# Patient Record
Sex: Female | Born: 1975 | Race: White | Hispanic: No | Marital: Single | Smoking: Current every day smoker
Health system: Southern US, Community
[De-identification: ages and names within clinical notes are randomized; demographics above are authoritative.]

## PROBLEM LIST (undated history)

## (undated) DIAGNOSIS — M5430 Sciatica, unspecified side: Secondary | ICD-10-CM

## (undated) DIAGNOSIS — M419 Scoliosis, unspecified: Secondary | ICD-10-CM

## (undated) DIAGNOSIS — F419 Anxiety disorder, unspecified: Secondary | ICD-10-CM

## (undated) DIAGNOSIS — K219 Gastro-esophageal reflux disease without esophagitis: Secondary | ICD-10-CM

## (undated) DIAGNOSIS — N83209 Unspecified ovarian cyst, unspecified side: Secondary | ICD-10-CM

## (undated) DIAGNOSIS — R87619 Unspecified abnormal cytological findings in specimens from cervix uteri: Secondary | ICD-10-CM

## (undated) DIAGNOSIS — F1910 Other psychoactive substance abuse, uncomplicated: Secondary | ICD-10-CM

## (undated) HISTORY — DX: Gastro-esophageal reflux disease without esophagitis: K21.9

## (undated) HISTORY — DX: Other psychoactive substance abuse, uncomplicated: F19.10

## (undated) HISTORY — PX: TUBAL LIGATION: SHX77

## (undated) HISTORY — DX: Anxiety disorder, unspecified: F41.9

## (undated) HISTORY — DX: Unspecified abnormal cytological findings in specimens from cervix uteri: R87.619

## (undated) HISTORY — DX: Unspecified ovarian cyst, unspecified side: N83.209

---

## 2012-02-08 ENCOUNTER — Ambulatory Visit: Payer: Self-pay | Admitting: Psychiatry

## 2012-02-08 ENCOUNTER — Encounter: Payer: Self-pay | Admitting: Psychiatry

## 2012-02-08 VITALS — BP 125/82 | HR 85 | Temp 97.5°F | Ht 62.99 in | Wt 149.0 lb

## 2012-02-08 DIAGNOSIS — F1120 Opioid dependence, uncomplicated: Secondary | ICD-10-CM

## 2012-02-08 DIAGNOSIS — F1920 Other psychoactive substance dependence, uncomplicated: Secondary | ICD-10-CM

## 2012-02-08 MED ORDER — MELATONIN 3 MG PO TABS *I*
3.0000 mg | ORAL_TABLET | Freq: Every evening | ORAL | Status: DC | PRN
Start: 2012-02-08 — End: 2012-05-12

## 2012-02-09 ENCOUNTER — Ambulatory Visit
Admit: 2012-02-09 | Discharge: 2012-02-09 | Disposition: A | Discharge status: Home / Self Care | Payer: Self-pay | Source: Ambulatory Visit | Attending: Psychiatry | Admitting: Psychiatry

## 2012-02-09 LAB — DATE/TIME NOT PROVIDED

## 2012-02-09 LAB — DRUG SCREEN CHEMICAL DEPENDENCY, URINE
Amphetamine,UR: NEGATIVE
Benzodiazepinen,UR: NEGATIVE
Cocaine/Metab,UR: NEGATIVE
Opiates,UR: NEGATIVE
THC Metabolite,UR: NEGATIVE

## 2012-02-09 NOTE — Progress Notes (Signed)
Subjective:     Patient ID: Lauren Shelton is a 36 y.o. female.    HPI  New patient - Patient presents without medical records from Lauren [facility]ton and [facility] after period of drug rehab.  We were anticipating taking over suboxone prescribing however patient's urine is positive for THC.  Without records we are unable to determine if Lauren Shelton quant is trending down (as patient states it should be due to last use prior to rehab).    Lauren Shelton  does not have a problem list on file.  Current Outpatient Prescriptions   Medication   . melatonin 3 MG     No current facility-administered medications for this visit.       Review of Systems   Psychiatric/Behavioral: Positive for hallucinations, behavioral problems, sleep disturbance, dysphoric mood and agitation. The patient is nervous/anxious.              Objective:   Physical Exam   Constitutional: She is oriented to person, place, and time. She appears well-developed and well-nourished. She appears distressed.   HENT:   Head: Normocephalic and atraumatic.   Eyes: EOM are normal. Pupils are equal, round, and reactive to light. Right eye exhibits no discharge. Left eye exhibits no discharge. No scleral icterus.   Neurological: She is alert and oriented to person, place, and time. She displays normal reflexes. No cranial nerve deficit. She exhibits abnormal muscle tone. Coordination normal.   Skin: She is not diaphoretic.   BP 125/82  Pulse 85  Temp(Src) 36.4 C (97.5 F)  Ht 1.6 m (5' 2.99")  Wt 67.586 kg (149 lb)  BMI 26.4 kg/m2  SpO2 98%  LMP 01/15/2012          Assessment:      36 yo with opiate dependence presents for new physical which was unable to be completed today due to lack of medical records and request for controlled substances, psychiatric medications and THC positive urine.          Plan:      1) Opiate dependence - No suboxone refilled today due to THC positive.  Awaiting quantitative confirmation.  Patient getting bridge script from Lauren Shelton.  Anticipate filling suboxone 8-2mg  1.5 tablets daily for 28 days with clean urine.    2) Follow up - Visit spent obtaining contact information to fax records release.  Releases faxed successfully.  Awaiting records.  Patient has bridge scripts for psychiatric medications.

## 2012-02-21 ENCOUNTER — Encounter: Payer: Self-pay | Admitting: Psychiatry

## 2012-02-21 ENCOUNTER — Ambulatory Visit: Payer: Self-pay | Admitting: Psychiatry

## 2012-02-21 VITALS — BP 130/73 | HR 85 | Temp 98.4°F | Ht 62.5 in | Wt 152.0 lb

## 2012-02-21 DIAGNOSIS — M549 Dorsalgia, unspecified: Secondary | ICD-10-CM

## 2012-02-21 DIAGNOSIS — G4700 Insomnia, unspecified: Secondary | ICD-10-CM | POA: Insufficient documentation

## 2012-02-21 DIAGNOSIS — F32A Depression, unspecified: Secondary | ICD-10-CM | POA: Insufficient documentation

## 2012-02-21 DIAGNOSIS — F1120 Opioid dependence, uncomplicated: Secondary | ICD-10-CM | POA: Insufficient documentation

## 2012-02-21 DIAGNOSIS — F419 Anxiety disorder, unspecified: Secondary | ICD-10-CM

## 2012-02-21 MED ORDER — THERMACARE BACK/HIP MISC
1.0000 | Freq: Every day | Status: AC
Start: 2012-02-21 — End: ?

## 2012-02-21 MED ORDER — BUPRENORPHINE HCL-NALOXONE HCL 8-2 MG SL SUBL *I*
1.0000 | SUBLINGUAL_TABLET | Freq: Every day | SUBLINGUAL | Status: DC
Start: 2012-02-21 — End: 2012-03-20

## 2012-02-21 MED ORDER — MELOXICAM 7.5 MG PO TABS *I*
7.5000 mg | ORAL_TABLET | Freq: Every day | ORAL | Status: AC
Start: 2012-02-21 — End: 2012-03-22

## 2012-02-21 MED ORDER — VENLAFAXINE HCL 37.5 MG PO CP24 *I*
75.0000 mg | ORAL_CAPSULE | Freq: Every day | ORAL | Status: DC
Start: 2012-02-21 — End: 2012-03-21

## 2012-02-21 MED ORDER — HYDROXYZINE PAMOATE 25 MG PO CAPS *I*
25.0000 mg | ORAL_CAPSULE | Freq: Every day | ORAL | Status: DC | PRN
Start: 2012-02-21 — End: 2012-05-12

## 2012-02-21 NOTE — Progress Notes (Signed)
Subjective:     Patient ID: Lauren Shelton is a 36 y.o. female.    HPI  1) 36 yo with opiate dependence with successful completion of rehab at [facility].  SHe is motivated to get custody of her children back and also pay off a DWI fine.    Review of Systems   Constitutional: Negative for fever, chills, diaphoresis, activity change, appetite change, fatigue and unexpected weight change.   Gastrointestinal: Negative for abdominal pain and abdominal distention.   Musculoskeletal: Positive for back pain, arthralgias and gait problem.   Psychiatric/Behavioral: Positive for sleep disturbance, dysphoric mood and decreased concentration. Negative for suicidal ideas. The patient is nervous/anxious.              Objective:   Physical Exam   Constitutional: She is oriented to person, place, and time. She appears well-developed and well-nourished. No distress.   HENT:   Head: Normocephalic and atraumatic.   Cardiovascular: Normal rate and regular rhythm.    No murmur heard.  Pulmonary/Chest: Effort normal and breath sounds normal. No respiratory distress.   Musculoskeletal:   Scoliosis noted with left curvature of spine.   Neurological: She is alert and oriented to person, place, and time. No cranial nerve deficit.   Skin: She is not diaphoretic.   Psychiatric: She has a normal mood and affect.             Assessment:      Opiate dependence        Plan:      1) Opiate dependence - Suboxone refilled today 8-2mg  1.5 tablets daily for 28 days with clean urine. THC negative.

## 2012-03-20 ENCOUNTER — Ambulatory Visit: Payer: Self-pay | Admitting: Psychiatry

## 2012-03-20 ENCOUNTER — Encounter: Payer: Self-pay | Admitting: Psychiatry

## 2012-03-20 VITALS — BP 128/81 | HR 72 | Temp 97.5°F | Ht 62.6 in | Wt 162.0 lb

## 2012-03-20 DIAGNOSIS — F1120 Opioid dependence, uncomplicated: Secondary | ICD-10-CM

## 2012-03-20 LAB — POCT URINE DRUG SCREEN 12 PANEL
AMP (1000ng/mL): NEGATIVE
BAR (300ng/mL): NEGATIVE ng/mL
BZD (300 ng/mL): NEGATIVE
COC (300ng/mL): NEGATIVE
Kit Lot: 1311003
MDMA (500ng/mL): NEGATIVE
MET (1000ng/mL): NEGATIVE
MTD (300ng/mL): NEGATIVE
OPI (300ng/mL): NEGATIVE
OXY (100ng/mL): NEGATIVE
PCP (25ng/mL): NEGATIVE
TCA (1000ng/mL): NEGATIVE
THC (50ng/mL): NEGATIVE

## 2012-03-20 MED ORDER — BUPRENORPHINE HCL-NALOXONE HCL 8-2 MG SL SUBL *I*
1.0000 | SUBLINGUAL_TABLET | Freq: Every day | SUBLINGUAL | Status: AC
Start: 2012-03-20 — End: 2012-04-17

## 2012-03-20 NOTE — Progress Notes (Signed)
Subjective:     Patient ID: Lauren Shelton is a 36 y.o. female.    HPI  1) 36 yo with opiate dependence with successful completion of rehab at [facility]. SHe is motivated to get custody of her children back and also pay off a DWI fine.    Lauren Shelton has Back pain; Anxiety; Depression; Opiate dependence; and Insomnia on her problem list.  Current Outpatient Prescriptions   Medication Sig Note   . buprenorphine-naloxone (SUBOXONE) 8-2 MG SL tablet Place 1 tablet under the tongue daily   MDD 1.5 tablets    . venlafaxine (EFFEXOR-XR) 37.5 MG 24 hr capsule Take 2 capsules (75 mg total) by mouth daily   After initiating with 1 tablet daily for 1 week    . hydrOXYzine pamoate (VISTARIL) 25 MG capsule Take 1 capsule (25 mg total) by mouth daily as needed for Anxiety or Sleep   May take 2 capsules at once.  MDD=2    . meloxicam (MOBIC) 7.5 MG tablet Take 1 tablet (7.5 mg total) by mouth daily    . melatonin 3 MG Take 1 tablet (3 mg total) by mouth nightly as needed for Sleep    . Heat Wraps (THERMACARE BACK/HIP) MISC By 1 each no specified route daily 03/20/2012: Insurance does not cover     No current facility-administered medications for this visit.       Review of Systems  Constitutional: Negative for fever, chills, diaphoresis, activity change, appetite change, fatigue and unexpected weight change.   Gastrointestinal: Negative for abdominal pain and abdominal distention.   Musculoskeletal: Positive for back pain, arthralgias and gait problem.   Psychiatric/Behavioral: Positive for sleep disturbance, dysphoric mood and decreased concentration. Negative for suicidal ideas. The patient is nervous/anxious.        Objective:   Physical Exam  BP 128/81  Pulse 72  Temp(Src) 36.4 C (97.5 F)  Ht 1.59 m (5' 2.6")  Wt 73.483 kg (162 lb)  BMI 29.07 kg/m2  SpO2 99%  LMP 03/13/2012  Constitutional: She is oriented to person, place, and time. She appears well-developed and well-nourished. No distress.   HENT:   Head:  Normocephalic and atraumatic.   Cardiovascular: Normal rate and regular rhythm.   No murmur heard.   Pulmonary/Chest: Effort normal and breath sounds normal. No respiratory distress.   Musculoskeletal:   Scoliosis noted with left curvature of spine.   Neurological: She is alert and oriented to person, place, and time. No cranial nerve deficit.   Skin: She is not diaphoretic.   Psychiatric: She has a normal mood and affect          Assessment:      36 yo with opiate dependence.        Plan:      1) Opiate dependence - Suboxone refilled today 8-2mg  1.5 tablets daily for 28 days with clean urine. THC negative.

## 2012-03-21 ENCOUNTER — Other Ambulatory Visit: Payer: Self-pay | Admitting: Psychiatry

## 2012-03-21 DIAGNOSIS — F32A Depression, unspecified: Secondary | ICD-10-CM

## 2012-03-23 ENCOUNTER — Other Ambulatory Visit: Payer: Self-pay | Admitting: Psychiatry

## 2012-03-23 DIAGNOSIS — F32A Depression, unspecified: Secondary | ICD-10-CM

## 2012-03-23 MED ORDER — VENLAFAXINE HCL 37.5 MG PO CP24 *I*
75.0000 mg | ORAL_CAPSULE | Freq: Every day | ORAL | Status: DC
Start: 2012-03-23 — End: 2012-05-12

## 2012-04-03 ENCOUNTER — Telehealth: Payer: Self-pay

## 2012-04-03 NOTE — Telephone Encounter (Signed)
Lauren Shelton has been placed at the [facility] as temporary housing until her apartment is available Jan 1. She is sharing a room with another homeless person and discovered that her Effexor and Suboxone is missing. Pt filed a police report and spoke to [employer] about the loss. Will speak with Dr Dellis Anes tomorrow about the situation and call pt back.

## 2012-05-12 ENCOUNTER — Ambulatory Visit: Payer: Self-pay | Admitting: Psychiatry

## 2012-05-12 ENCOUNTER — Encounter: Payer: Self-pay | Admitting: Psychiatry

## 2012-05-12 VITALS — BP 131/85 | HR 115 | Temp 97.2°F | Ht 62.6 in | Wt 174.0 lb

## 2012-05-12 DIAGNOSIS — F419 Anxiety disorder, unspecified: Secondary | ICD-10-CM

## 2012-05-12 DIAGNOSIS — F1120 Opioid dependence, uncomplicated: Secondary | ICD-10-CM

## 2012-05-12 DIAGNOSIS — Z760 Encounter for issue of repeat prescription: Secondary | ICD-10-CM

## 2012-05-12 LAB — POCT URINE DRUG SCREEN 12 PANEL
AMP (1000ng/mL): NEGATIVE
BAR (300ng/mL): NEGATIVE ng/mL
BZD (300 ng/mL): NEGATIVE
COC (300ng/mL): NEGATIVE
Kit Lot: 1311003
MDMA (500ng/mL): NEGATIVE
MET (1000ng/mL): NEGATIVE
MTD (300ng/mL): NEGATIVE
OPI (300ng/mL): NEGATIVE
OXY (100ng/mL): NEGATIVE
PCP (25ng/mL): NEGATIVE
TCA (1000ng/mL): NEGATIVE
THC (50ng/mL): NEGATIVE

## 2012-05-12 MED ORDER — VENLAFAXINE HCL 150 MG PO CP24 *I*
150.0000 mg | ORAL_CAPSULE | Freq: Every day | ORAL | Status: DC
Start: 2012-05-12 — End: 2012-07-07

## 2012-05-12 MED ORDER — HYDROXYZINE PAMOATE 25 MG PO CAPS *I*
25.0000 mg | ORAL_CAPSULE | Freq: Every day | ORAL | Status: AC | PRN
Start: 2012-05-12 — End: 2012-06-09

## 2012-05-12 MED ORDER — BUPRENORPHINE HCL-NALOXONE HCL 8-2 MG SL SUBL *I*
1.0000 | SUBLINGUAL_TABLET | Freq: Every day | SUBLINGUAL | Status: DC
Start: 2012-05-12 — End: 2012-06-09

## 2012-05-12 NOTE — Progress Notes (Signed)
Subjective:     Patient ID: Lauren Shelton is a 37 y.o. female.    HPI  1) Opiate dependence - 37 yo with opiate dependence with successful completion of rehab at [facility]. She is motivated to get custody of her children back and also pay off a DWI fine.    2) Depression - Patient reports insomnia, excessive worry, low mood, fatigue and decreased interest.  She reports having access to psychiatry but has not scheduled a visit in a long time.  Requested consideration of increasing her Effexor.  Side effect profile discussed at length.  Patient denies seide effects.    Mylia has Back pain; Anxiety; Depression; Opiate dependence; and Insomnia on her problem list.    Review of Systems   Constitutional: Positive for activity change, appetite change, fatigue and unexpected weight change. Negative for fever and chills.        Decreased activity due to psychosocial stressors.  Weight gain.   Psychiatric/Behavioral: Positive for sleep disturbance, dysphoric mood and agitation. Negative for hallucinations. The patient is nervous/anxious. The patient is not hyperactive.              Objective:   Physical Exam   Constitutional: She is oriented to person, place, and time. She appears well-developed and well-nourished. No distress.   Pulmonary/Chest: Effort normal. No respiratory distress.   Neurological: She is alert and oriented to person, place, and time. No cranial nerve deficit.   Skin: She is not diaphoretic.             Assessment:      37 yo with opiate dependence, psychosocial stressors and depression        Plan:      1) Opiate dependence - Suboxone refilled today 8-2mg  1.5 tablets daily for 28 days with clean urine. THC negative.    2) Depression - Increased Effexor to 150 mg daily.  Side effect profile reviewed.  Questions answered.

## 2012-05-22 ENCOUNTER — Encounter: Payer: Self-pay | Admitting: Psychiatry

## 2012-06-09 ENCOUNTER — Ambulatory Visit: Payer: Self-pay | Admitting: Psychiatry

## 2012-06-09 ENCOUNTER — Encounter: Payer: Self-pay | Admitting: Psychiatry

## 2012-06-09 VITALS — BP 126/68 | HR 62 | Temp 97.2°F | Ht 62.6 in | Wt 167.0 lb

## 2012-06-09 LAB — POCT URINE DRUG SCREEN 12 PANEL
AMP (1000ng/mL): NEGATIVE
BAR (300ng/mL): NEGATIVE ng/mL
BZD (300 ng/mL): NEGATIVE
COC (300ng/mL): NEGATIVE
Kit Lot: 1311003
MDMA (500ng/mL): NEGATIVE
MET (1000ng/mL): NEGATIVE
MTD (300ng/mL): NEGATIVE
OPI (300ng/mL): NEGATIVE
OXY (100ng/mL): NEGATIVE
PCP (25ng/mL): NEGATIVE
TCA (1000ng/mL): NEGATIVE
THC (50ng/mL): NEGATIVE

## 2012-06-09 MED ORDER — BUPRENORPHINE HCL-NALOXONE HCL 8-2 MG SL SUBL *I*
1.0000 | SUBLINGUAL_TABLET | Freq: Every day | SUBLINGUAL | Status: DC
Start: 2012-06-09 — End: 2012-07-07

## 2012-06-09 NOTE — Progress Notes (Signed)
Subjective:     Patient ID: Lauren Shelton is a 37 y.o. female.    HPI  1) Opiate dependence - 37 yo with opiate dependence with successful completion of rehab at [facility] followed by Victorino Dike who has let [facility] know that [place] has missed several appointments including group and mental health appointments. She is motivated to get custody of her children back and also pay off a DWI fine.     2) Depression - Patient reports insomnia, excessive worry, low mood, fatigue and decreased interest. She reports having access to psychiatry but has not scheduled a visit in a long time. Requested consideration of increasing her Effexor. Side effect profile discussed at length. Patient denies side effects.     [name] has Back pain; Anxiety; Depression; Opiate dependence; and Insomnia on her problem list.    Review of Systems   All other systems reviewed and are negative.              Objective:   Physical Exam   Constitutional: She is oriented to person, place, and time. She appears well-developed and well-nourished. No distress.   Cardiovascular: Normal rate and regular rhythm.    No murmur heard.  Pulmonary/Chest: Effort normal and breath sounds normal. No respiratory distress.   Neurological: She is alert and oriented to person, place, and time.   Skin: Skin is warm and dry. She is not diaphoretic.   Psychiatric: She has a normal mood and affect.             Assessment:      37 yo with opiate dependence        Plan:      1) Opiate dependence - Suboxone refilled today 8-2mg  1.5 tablets daily for 28 days with clean urine. THC negative. Zero refills.  - Consider one refill at next appointment with clean urine screen and note of improvement from patient's counselor, Victorino Dike.    2) Depression - Improved with increased Effexor to 150 mg daily last visit.

## 2012-07-07 ENCOUNTER — Encounter: Payer: Self-pay | Admitting: Psychiatry

## 2012-07-07 ENCOUNTER — Ambulatory Visit: Payer: Self-pay | Admitting: Psychiatry

## 2012-07-07 VITALS — BP 132/78 | HR 73 | Temp 97.2°F | Ht 62.6 in | Wt 164.0 lb

## 2012-07-07 DIAGNOSIS — F1120 Opioid dependence, uncomplicated: Secondary | ICD-10-CM

## 2012-07-07 DIAGNOSIS — F32A Depression, unspecified: Secondary | ICD-10-CM

## 2012-07-07 LAB — POCT URINE DRUG SCREEN 12 PANEL
AMP (1000ng/mL): NEGATIVE
BAR (300ng/mL): NEGATIVE ng/mL
BZD (300 ng/mL): NEGATIVE
COC (300ng/mL): NEGATIVE
Kit Lot: 1311003
MDMA (500ng/mL): NEGATIVE
MET (1000ng/mL): NEGATIVE
MTD (300ng/mL): NEGATIVE
OPI (300ng/mL): NEGATIVE
OXY (100ng/mL): NEGATIVE
PCP (25ng/mL): NEGATIVE
TCA (1000ng/mL): NEGATIVE
THC (50ng/mL): NEGATIVE

## 2012-07-07 MED ORDER — VENLAFAXINE HCL 150 MG PO CP24 *I*
150.0000 mg | ORAL_CAPSULE | Freq: Every day | ORAL | Status: AC
Start: 2012-07-07 — End: ?

## 2012-07-07 MED ORDER — BUPRENORPHINE HCL-NALOXONE HCL 8-2 MG SL SUBL *I*
1.5000 | SUBLINGUAL_TABLET | Freq: Every day | SUBLINGUAL | Status: AC
Start: 2012-07-07 — End: 2012-08-04

## 2012-07-19 NOTE — Progress Notes (Signed)
Subjective:     Patient ID: Lauren Shelton is a 37 y.o. female.    HPI  1) Opiate dependence - 37 yo with opiate dependence with successful completion of rehab at [facility] followed by Victorino Dike.  Patient is motivated to get custody of her children back and also pay off a DWI fine.     2) Depression - Patient reports improved insomnia, excessive worry, low mood, fatigue and decreased interest. She reports having access to psychiatry but has not scheduled a visit in a long time. Requested consideration of increasing her Effexor. Side effect profile discussed at length. Patient denies side effects.     Lauren Shelton has Back pain; Anxiety; Depression; Opiate dependence; and Insomnia on her problem list.    Review of Systems   All other systems reviewed and are negative.              Objective:   Physical Exam  BP 132/78  Pulse 73  Temp(Src) 36.2 C (97.2 F)  Ht 1.59 m (5' 2.6")  Wt 74.39 kg (164 lb)  BMI 29.43 kg/m2  SpO2 98%  LMP 06/30/2012  Constitutional: She is oriented to person, place, and time. She appears well-developed and well-nourished. No distress.   Cardiovascular: Normal rate and regular rhythm.   No murmur heard.   Pulmonary/Chest: Effort normal and breath sounds normal. No respiratory distress.   Neurological: She is alert and oriented to person, place, and time.   Skin: Skin is warm and dry. She is not diaphoretic.   Psychiatric: She has a normal mood and affect          Assessment:      37 yo with opiate dependence          Plan:      1) Opiate dependence - Suboxone refilled today 8-2mg  1.5 tablets daily for 28 days with clean urine. THC negative. One refill dispensed today.   - Consider one refill at next appointment with clean urine screen and note of improvement from patient's counselor, Victorino Dike.     2) Depression - Improved with increased Effexor to 150 mg daily

## 2012-08-07 ENCOUNTER — Telehealth: Payer: Self-pay

## 2012-08-07 NOTE — Telephone Encounter (Signed)
Call received from [place] [facility]. She left message of concern regarding [place]. States she heard from family that [place] has relapsed on heroin and is diverting her Suboxone to pay for it. Victorino Dike states she has release to speak with the family, but has not seen Anjali since the report of needles seen in her home, track marks on her legs and nodding behaviors while with family over the holiday. Savannaha has been attending program and is due to see Victorino Dike today. She will have tox screen done today. Victorino Dike will call to follow up on this.

## 2012-08-31 ENCOUNTER — Ambulatory Visit: Payer: Self-pay | Admitting: Psychiatry

## 2012-08-31 ENCOUNTER — Encounter: Payer: Self-pay | Admitting: Psychiatry

## 2012-08-31 VITALS — BP 111/73 | HR 71 | Temp 96.8°F | Ht 62.6 in | Wt 157.0 lb

## 2012-08-31 DIAGNOSIS — F1120 Opioid dependence, uncomplicated: Secondary | ICD-10-CM

## 2012-08-31 DIAGNOSIS — Z79899 Other long term (current) drug therapy: Secondary | ICD-10-CM

## 2012-08-31 LAB — POCT URINE DRUG SCREEN 12 PANEL
AMP (1000ng/mL): NEGATIVE
BAR (300ng/mL): NEGATIVE ng/mL
BZD (300 ng/mL): NEGATIVE
COC (300ng/mL): NEGATIVE
Kit Lot: 1311003
MDMA (500ng/mL): NEGATIVE
MET (1000ng/mL): NEGATIVE
MTD (300ng/mL): NEGATIVE
OPI (300ng/mL): NEGATIVE
OXY (100ng/mL): NEGATIVE
PCP (25ng/mL): POSITIVE
TCA (1000ng/mL): NEGATIVE
THC (50ng/mL): NEGATIVE

## 2012-08-31 MED ORDER — BUPRENORPHINE HCL-NALOXONE HCL 8-2 MG SL SUBL *I*
1.0000 | SUBLINGUAL_TABLET | Freq: Every day | SUBLINGUAL | Status: DC
Start: 2012-08-31 — End: 2012-09-01

## 2012-09-01 MED ORDER — BUPRENORPHINE HCL-NALOXONE HCL 8-2 MG SL SUBL *I*
1.5000 | SUBLINGUAL_TABLET | Freq: Every day | SUBLINGUAL | Status: AC
Start: 2012-09-01 — End: 2012-09-29

## 2013-04-23 NOTE — Progress Notes (Signed)
Subjective:     Patient ID: Lauren Shelton is a 38 y.o. female.    HPI  1) Opiate dependence - 38 yo with opiate dependence with successful completion of rehab at [facility] followed by Victorino Dike. Patient is motivated to get custody of her children back and also pay off a DWI fine. Noted irregularities in program attendance require follow up between PCP and Victorino Dike which was discussed with patient today.    Joani has Back pain; Anxiety; Depression; Opiate dependence; and Insomnia on her problem list.    Review of Systems   Psychiatric/Behavioral: Positive for dysphoric mood. Negative for hallucinations, behavioral problems and confusion. The patient is nervous/anxious.              Objective:   Physical Exam  BP 111/73  Pulse 71  Temp(Src) 36 C (96.8 F)  Ht 1.59 m (5' 2.6")  Wt 71.215 kg (157 lb)  BMI 28.17 kg/m2  SpO2 98%  LMP 08/31/2012  Constitutional: She is oriented to person, place, and time. She appears well-developed and well-nourished. No distress.   Cardiovascular: Normal rate and regular rhythm.   No murmur heard.   Pulmonary/Chest: Effort normal and breath sounds normal. No respiratory distress.   Neurological: She is alert and oriented to person, place, and time.   Skin: Skin is warm and dry. She is not diaphoretic.   Psychiatric: She has a normal mood and affect          Assessment:      38 yo with opiate dependence        Plan:      1) Opiate dependence - Suboxone refilled today 8-2mg  1.5 tablets daily for 28 days with clean urine. THC negative. One refill dispensed today.   - Awaiting communication with Victorino Dike.     2) Depression - Improved with increased Effexor to 150 mg daily

## 2013-11-16 ENCOUNTER — Encounter: Payer: Self-pay | Admitting: Gastroenterology

## 2014-12-04 ENCOUNTER — Encounter (HOSPITAL_COMMUNITY): Payer: Self-pay

## 2014-12-04 ENCOUNTER — Emergency Department (HOSPITAL_COMMUNITY)
Admission: EM | Admit: 2014-12-04 | Discharge: 2014-12-04 | Disposition: A | Discharge status: Home / Self Care | Payer: Medicaid Other | Attending: Emergency Medicine | Admitting: Emergency Medicine

## 2014-12-04 ENCOUNTER — Emergency Department (HOSPITAL_COMMUNITY): Payer: Medicaid Other

## 2014-12-04 DIAGNOSIS — R110 Nausea: Secondary | ICD-10-CM | POA: Insufficient documentation

## 2014-12-04 DIAGNOSIS — Z9851 Tubal ligation status: Secondary | ICD-10-CM | POA: Insufficient documentation

## 2014-12-04 DIAGNOSIS — R1031 Right lower quadrant pain: Secondary | ICD-10-CM | POA: Insufficient documentation

## 2014-12-04 DIAGNOSIS — Z3202 Encounter for pregnancy test, result negative: Secondary | ICD-10-CM | POA: Diagnosis not present

## 2014-12-04 DIAGNOSIS — N939 Abnormal uterine and vaginal bleeding, unspecified: Secondary | ICD-10-CM | POA: Diagnosis not present

## 2014-12-04 DIAGNOSIS — R52 Pain, unspecified: Secondary | ICD-10-CM

## 2014-12-04 DIAGNOSIS — Z720 Tobacco use: Secondary | ICD-10-CM | POA: Diagnosis not present

## 2014-12-04 LAB — URINALYSIS, ROUTINE W REFLEX MICROSCOPIC
BILIRUBIN URINE: NEGATIVE
Glucose, UA: NEGATIVE mg/dL
HGB URINE DIPSTICK: NEGATIVE
Ketones, ur: 15 mg/dL — AB
Leukocytes, UA: NEGATIVE
Nitrite: NEGATIVE
PH: 7 (ref 5.0–8.0)
Protein, ur: NEGATIVE mg/dL
SPECIFIC GRAVITY, URINE: 1.026 (ref 1.005–1.030)
UROBILINOGEN UA: 1 mg/dL (ref 0.0–1.0)

## 2014-12-04 LAB — COMPREHENSIVE METABOLIC PANEL
ALBUMIN: 4.5 g/dL (ref 3.5–5.0)
ALT: 15 U/L (ref 14–54)
ANION GAP: 7 (ref 5–15)
AST: 20 U/L (ref 15–41)
Alkaline Phosphatase: 62 U/L (ref 38–126)
BUN: 19 mg/dL (ref 6–20)
CHLORIDE: 107 mmol/L (ref 101–111)
CO2: 25 mmol/L (ref 22–32)
Calcium: 9.3 mg/dL (ref 8.9–10.3)
Creatinine, Ser: 0.65 mg/dL (ref 0.44–1.00)
GFR calc Af Amer: >60 mL/min (ref >60)
GFR calc non Af Amer: >60 mL/min (ref >60)
GLUCOSE: 91 mg/dL (ref 65–99)
POTASSIUM: 4.2 mmol/L (ref 3.5–5.1)
SODIUM: 139 mmol/L (ref 135–145)
TOTAL PROTEIN: 7.8 g/dL (ref 6.5–8.1)
Total Bilirubin: 0.8 mg/dL (ref 0.3–1.2)

## 2014-12-04 LAB — CBC
HEMATOCRIT: 34.2 % — AB (ref 36.0–46.0)
Hemoglobin: 11 g/dL — ABNORMAL LOW (ref 12.0–15.0)
MCH: 28.5 pg (ref 26.0–34.0)
MCHC: 32.2 g/dL (ref 30.0–36.0)
MCV: 88.6 fL (ref 78.0–100.0)
Platelets: 302 10*3/uL (ref 150–400)
RBC: 3.86 MIL/uL — ABNORMAL LOW (ref 3.87–5.11)
RDW: 14.7 % (ref 11.5–15.5)
WBC: 8.1 10*3/uL (ref 4.0–10.5)

## 2014-12-04 LAB — LIPASE, BLOOD: LIPASE: 20 U/L — AB (ref 22–51)

## 2014-12-04 LAB — HCG, QUANTITATIVE, PREGNANCY

## 2014-12-04 LAB — I-STAT BETA HCG BLOOD, ED (MC, WL, AP ONLY): HCG, QUANTITATIVE: 6.2 m[IU]/mL — AB (ref <5)

## 2014-12-04 MED ORDER — HYDROCODONE-ACETAMINOPHEN 5-325 MG PO TABS: 2.0000 | ORAL_TABLET | ORAL | Status: DC | PRN

## 2014-12-04 NOTE — ED Notes (Signed)
Patient c/o right lower abdominal pain. Patient states she had a tubal ligation 4 months ago andstates that she has been having intermittent vaginal bleeding x 3 weeks. Patient also c/o nausea. Patient denies dysuria or fever.

## 2014-12-04 NOTE — ED Provider Notes (Signed)
Medical screening examination/treatment/procedure(s) were conducted as a shared visit with non-physician practitioner(s) and myself.  I personally evaluated the patient during the encounter.   EKG Interpretation None     Pt with lower abdominal pain, ongoing, tender in right lower quadrant, do not suspect appy currently with clinical appearance. Likely d/c if imaging/pelvic negative.   See related encounter note   Lyndal Pulley, MD 12/04/14 2102

## 2014-12-04 NOTE — Discharge Instructions (Signed)

## 2014-12-04 NOTE — ED Provider Notes (Signed)
CSN: [id number]     Arrival date & time 12/04/14  1427 History   First MD Initiated Contact with Patient 12/04/14 1806     Chief Complaint  Patient presents with  . Abdominal Pain  . Nausea  . Vaginal Bleeding     (Consider location/radiation/quality/duration/timing/severity/associated sxs/prior Treatment) Patient is a 39 y.o. female presenting with abdominal pain. The history is provided by the patient. No language interpreter was used.  Abdominal Pain Pain location:  RLQ Pain quality: aching   Pain radiates to:  Does not radiate Pain severity:  Moderate Onset quality:  Gradual Timing:  Constant Progression:  Worsening Chronicity:  New Context: previous surgery   Context: not recent sexual activity   Relieved by:  Nothing Worsened by:  Nothing tried Ineffective treatments:  None tried Associated symptoms: no dysuria, no nausea and no vomiting   Risk factors: has not had multiple surgeries and not pregnant   Pt had a Tubal ligation 4 months ago.  Pt reports she has had vaginal bleeding for the past 3 weeks.  Pt reports increasing discomfort in right lower abdomen.  Pt worried about something being wrong with "clips"   History reviewed. No pertinent past medical history. Past Surgical History  Procedure Laterality Date  . Tubal ligation    . Cesarean section     History reviewed. No pertinent family history. Social History  Substance Use Topics  . Smoking status: Current Every Day Smoker -- 0.15 packs/day    Types: Cigarettes  . Smokeless tobacco: Never Used  . Alcohol Use: No   OB History    No data available     Review of Systems  Gastrointestinal: Positive for abdominal pain. Negative for nausea and vomiting.  Genitourinary: Negative for dysuria.  All other systems reviewed and are negative.     Allergies  Review of patient's allergies indicates no known allergies.  Home Medications   Prior to Admission medications   Medication Sig Start Date End Date  Taking? Authorizing Provider  ibuprofen (ADVIL,MOTRIN) 200 MG tablet Take 800-1,000 mg by mouth every 6 (six) hours as needed for headache, mild pain or moderate pain.   Yes Historical Provider, MD   BP 77/58 mmHg  Pulse 64  Temp(Src) 98.2 F (36.8 C) (Oral)  Resp 18  Ht  (1.6 m)  Wt 145 lb (65.772 kg)  BMI 25.69 kg/m2  SpO2 100%  LMP 12/04/2014 Physical Exam  Constitutional: She is oriented to person, place, and time. She appears well-developed and well-nourished.  HENT:  Head: Normocephalic and atraumatic.  Eyes: Conjunctivae and EOM are normal. Pupils are equal, round, and reactive to light.  Neck: Normal range of motion.  Cardiovascular: Normal rate and normal heart sounds.   Pulmonary/Chest: Effort normal.  Abdominal: She exhibits no distension. There is tenderness.  Genitourinary: Vagina normal.  Mild bleeding  Musculoskeletal: Normal range of motion.  Neurological: She is alert and oriented to person, place, and time.  Skin: Skin is warm.  Psychiatric: She has a normal mood and affect.  Nursing note and vitals reviewed.   ED Course  Procedures (including critical care time) Labs Review Labs Reviewed  LIPASE, BLOOD - Abnormal; Notable for the following:    Lipase 20 (*)    All other components within normal limits  CBC - Abnormal; Notable for the following:    RBC 3.86 (*)    Hemoglobin 11.0 (*)    HCT 34.2 (*)    All other components within normal limits  URINALYSIS, ROUTINE W REFLEX MICROSCOPIC (NOT AT [facility]) - Abnormal; Notable for the following:    APPearance CLOUDY (*)    Ketones, ur 15 (*)    All other components within normal limits  I-STAT BETA HCG BLOOD, ED ([facility], [facility], [facility] ONLY) - Abnormal; Notable for the following:    I-stat hCG, quantitative 6.2 (*)    All other components within normal limits  COMPREHENSIVE METABOLIC PANEL  HCG, QUANTITATIVE, PREGNANCY    Imaging Review Us Transvaginal Non-ob  12/04/2014   CLINICAL DATA:  Right lower quadrant  pain  EXAM: TRANSABDOMINAL AND TRANSVAGINAL ULTRASOUND OF PELVIS  TECHNIQUE: Both transabdominal and transvaginal ultrasound examinations of the pelvis were performed. Transabdominal technique was performed for global imaging of the pelvis including uterus, ovaries, adnexal regions, and pelvic cul-de-sac. It was necessary to proceed with endovaginal exam following the transabdominal exam to visualize the endometrium and ovaries.  COMPARISON:  None  FINDINGS: Uterus  Measurements: 8.5 x 4.5 x 6.1 cm. No fibroids or other mass visualized.  Endometrium  Thickness: 8 mm. Small 4 mm hypoechoic area at the distal most aspect of the endometrium near the fundus which maybe adjacent to versus within the endometrium.  Right ovary  Measurements: 2.5 x 1.6 x 1.6 cm. Normal appearance/no adnexal mass.  Left ovary  Measurements: 3.2 x 1.5 x 2.6 cm. Normal appearance/no adnexal mass.  Other findings  No free fluid.  IMPRESSION: 1. No acute pelvic abnormality. 2. Small 4 mm hypoechoic area at the distal most aspect of the endometrium near the fundus which maybe adjacent to versus within the endometrium. This may represent a small amount of fluid versus a hypoechoic lesion. Recommend follow-up pelvic ultrasound in 6-8 weeks.   Electronically Signed   By: Hetal  Patel   On: 12/04/2014 20:35   Us Pelvis Complete  12/04/2014   CLINICAL DATA:  Right lower quadrant pain  EXAM: TRANSABDOMINAL AND TRANSVAGINAL ULTRASOUND OF PELVIS  TECHNIQUE: Both transabdominal and transvaginal ultrasound examinations of the pelvis were performed. Transabdominal technique was performed for global imaging of the pelvis including uterus, ovaries, adnexal regions, and pelvic cul-de-sac. It was necessary to proceed with endovaginal exam following the transabdominal exam to visualize the endometrium and ovaries.  COMPARISON:  None  FINDINGS: Uterus  Measurements: 8.5 x 4.5 x 6.1 cm. No fibroids or other mass visualized.  Endometrium  Thickness: 8 mm. Small  4 mm hypoechoic area at the distal most aspect of the endometrium near the fundus which maybe adjacent to versus within the endometrium.  Right ovary  Measurements: 2.5 x 1.6 x 1.6 cm. Normal appearance/no adnexal mass.  Left ovary  Measurements: 3.2 x 1.5 x 2.6 cm. Normal appearance/no adnexal mass.  Other findings  No free fluid.  IMPRESSION: 1. No acute pelvic abnormality. 2. Small 4 mm hypoechoic area at the distal most aspect of the endometrium near the fundus which maybe adjacent to versus within the endometrium. This may represent a small amount of fluid versus a hypoechoic lesion. Recommend follow-up pelvic ultrasound in 6-8 weeks.   Electronically Signed   By: Hetal  Patel   On: 12/04/2014 20:35   I have personally reviewed and evaluated these images and lab results as part of my medical decision-making.   EKG Interpretation None      MDM normal wbc count  istat hcg is 6.2.   (lab hcg is less than 1)  Pelvis ultrasound shows [id number]ryland34miVernMaryland37miVernMaryland23miVernMaryland40miVernMaryland77miVernMaryland33miVernMaryland1miVernMaryland68miVernMarylandie Murdersat may be small amount of fluid.  No evidence of ovarian  cyst,  Pt reports being hungry.  Clinical presentation seems to be gynecologic.  I doubt appendicitis given history.  (Pt complains of being tired and hungry and wants to go home.  She has made plans to see her Gyn in the am.  I advised pt she may need further evaluation including ct scan that may require further emergency department evaluation.  Pt understands.  Pt counseled on risk and possible diagnoses.    Final diagnoses:  Right lower quadrant abdominal pain        Elson Areas, PA-C 12/04/14 2118  Lyndal Pulley, MD 12/05/14 [phone]

## 2016-06-18 ENCOUNTER — Ambulatory Visit (INDEPENDENT_AMBULATORY_CARE_PROVIDER_SITE_OTHER): Payer: Medicaid Other | Admitting: Family Medicine

## 2016-06-18 ENCOUNTER — Encounter: Payer: Self-pay | Admitting: Family Medicine

## 2016-06-18 VITALS — BP 110/72 | HR 68 | Temp 97.7°F | Resp 18 | Ht 63.0 in | Wt 180.1 lb

## 2016-06-18 DIAGNOSIS — Z720 Tobacco use: Secondary | ICD-10-CM | POA: Diagnosis not present

## 2016-06-18 DIAGNOSIS — F1911 Other psychoactive substance abuse, in remission: Secondary | ICD-10-CM

## 2016-06-18 DIAGNOSIS — Z7689 Persons encountering health services in other specified circumstances: Secondary | ICD-10-CM

## 2016-06-18 DIAGNOSIS — Z13220 Encounter for screening for lipoid disorders: Secondary | ICD-10-CM

## 2016-06-18 DIAGNOSIS — M4125 Other idiopathic scoliosis, thoracolumbar region: Secondary | ICD-10-CM | POA: Diagnosis not present

## 2016-06-18 DIAGNOSIS — M419 Scoliosis, unspecified: Secondary | ICD-10-CM | POA: Insufficient documentation

## 2016-06-18 DIAGNOSIS — F411 Generalized anxiety disorder: Secondary | ICD-10-CM | POA: Diagnosis not present

## 2016-06-18 MED ORDER — IBUPROFEN 800 MG PO TABS: 800.0000 mg | ORAL_TABLET | Freq: Three times a day (TID) | ORAL | 1 refills | Status: DC | PRN

## 2016-06-18 MED ORDER — VENLAFAXINE HCL ER 75 MG PO CP24: 75.0000 mg | ORAL_CAPSULE | Freq: Every day | ORAL | 1 refills | Status: DC

## 2016-06-18 NOTE — Patient Instructions (Addendum)
Need referral to GYN for PAP and hormone evaluation  Need referral to orthopedic spine surgeon  Continue to stay as active as you can manage  You are due for blood work  I will prescribe ibuprofen 800 mg I will prescribe effexor for anxiety - take daily  See me in a month

## 2016-06-18 NOTE — Progress Notes (Signed)
Chief Complaint  Patient presents with  . Establish Care   Here to establish Chronic back pain Managed with NSAID and exercise Has known scoliosis Has GAD and has been on a number of meds, buspar, "valium", zoloft, paxil, effexor.  Of these the effexor worked the best. I have discussed the multiple health risks associated with cigarette smoking including, but not limited to, cardiovascular disease, lung disease and cancer.  I have strongly recommended that smoking be stopped.  I have reviewed the various methods of quitting including cold [place]key, classes, nicotine replacements and prescription medications.  I have offered assistance in this difficult process.  The patient is not interested in assistance at this time.  She only smokes 3 cig a day and feels able to stop History abnormal PAP at age 41 and again 2 y ago.  Recommend she go to GYN. States had addiction to pain medicines about 10 years ago and is committed to not taking again.   She does smoke MJ "to relax" a couple of times a week  Patient Active Problem List   Diagnosis Date Noted  . Tobacco abuse 06/18/2016  . Scoliosis 06/18/2016  . GAD (generalized anxiety disorder) 06/18/2016  . 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 [DISCONTINUED] ibuprofen (ADVIL,MOTRIN) 200 MG tablet Take 800-1,000 mg by mouth every 6 (six) hours as needed for headache, mild pain or moderate pain.  Marland Kitchen. ibuprofen (ADVIL,MOTRIN) 800 MG tablet Take 1 tablet (800 mg total) by mouth every 8 (eight) hours as needed.  . venlafaxine XR (EFFEXOR XR) 75 MG 24 hr capsule Take 1 capsule (75 mg total) by mouth daily with breakfast.   No facility-administered encounter medications on file as of 06/18/2016.     Past Medical History:  Diagnosis Date  . Abnormal Pap smear of cervix    cone, cryo - age 41  . Anxiety   . GERD (gastroesophageal reflux disease)   . Substance abuse    over 10  years    Past Surgical History:  Procedure Laterality Date  . CESAREAN SECTION    . TUBAL LIGATION      Social History   Social History  . Marital status: Single    Spouse name: BF-Christian  . Number of children: 4  . Years of education: 6116   Occupational History  . 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 LPN by training    Social History Main Topics  . Smoking status: Current Every Day Smoker    Packs/day: 0.15    Types: Cigarettes    Start date: 04/19/1993  . Smokeless tobacco: Never Used  . Alcohol use No  . Drug use: Yes    Types: Marijuana     Comment: 1-2 times a week  . Sexual activity: Yes    Birth control/ protection: Surgical   Other Topics Concern  . Not on file   Social History Narrative   Has had training in massage therapy and nails   Is an LPN      Currently self employed with BF as handyman      Lives with Rosiland OzChristian    Ryan 11   Christian 2    Family History  Problem Relation Age of Onset  . Miscarriages / [place] Mother   . Drug abuse Father   . Hypertension Father   . Kidney disease Father   . Cancer Father  kidney  . Asthma Brother   . Cancer Maternal Aunt     breast    Review of Systems  Constitutional: Negative for chills, fever and weight loss.  HENT: Negative for congestion and hearing loss.   Eyes: Negative for blurred vision and pain.  Respiratory: Negative for cough and shortness of breath.   Cardiovascular: Negative for chest pain and leg swelling.  Gastrointestinal: Positive for heartburn. Negative for abdominal pain, constipation and diarrhea.       Gets  Heartburn from NSAID use  Genitourinary: Negative for dysuria and frequency.  Musculoskeletal: Positive for back pain and joint pain. Negative for falls and myalgias.       Back and hip pain  Neurological: Negative for dizziness, seizures and headaches.  Psychiatric/Behavioral: Negative for depression. The patient is nervous/anxious. The patient does not  have insomnia.     BP 110/72 (BP Location: Right Arm, Patient Position: Sitting, Cuff Size: Normal)   Pulse 68   Temp 97.7 F (36.5 C) (Temporal)   Resp 18   Ht 5\' 3"  (1.6 m)   Wt 180 lb 1.9 oz (81.7 kg)   LMP 06/13/2016 (Exact Date)   SpO2 100%   BMI 31.91 kg/m   Physical Exam  Constitutional: She is oriented to person, place, and time. She appears well-developed and well-nourished.  Anxious.  uncomfortable  HENT:  Head: Normocephalic and atraumatic.  Right Ear: External ear normal.  Left Ear: External ear normal.  Mouth/Throat: Oropharynx is clear and moist.  Eyes: Conjunctivae are normal. Pupils are equal, round, and reactive to light.  Neck: Normal range of motion. Neck supple. No thyromegaly present.  Cardiovascular: Normal rate, regular rhythm and normal heart sounds.   Pulmonary/Chest: Effort normal and breath sounds normal. No respiratory distress.  Abdominal: Soft. Bowel sounds are normal.  Musculoskeletal: Normal range of motion. She exhibits no edema.  Obvious lumbar scoliosis  Lymphadenopathy:    She has no cervical adenopathy.  Neurological: She is alert and oriented to person, place, and time.  Gait normal  Skin: Skin is warm and dry.  Psychiatric: Her behavior is normal. Thought content normal.  Nursing note and vitals reviewed.     ASSESSMENT/PLAN:  1. Tobacco abuse   2. Other idiopathic scoliosis, thoracolumbar region - Ambulatory referral to Orthopedic Surgery   3. GAD (generalized anxiety disorder) - COMPLETE METABOLIC PANEL WITH GFR - CBC - Lipid panel - TSH - Urinalysis, Routine w reflex microscopic  4. Substance abuse in remission  5. Encounter to establish care with new doctor  6. Screening for lipid disorders    Patient Instructions  Need referral to GYN for PAP and hormone evaluation  Need referral to orthopedic spine surgeon  Continue to stay as active as you can manage  You are due for blood work  I will prescribe  ibuprofen 800 mg I will prescribe effexor for anxiety - take daily  See me in a month     Eustace Moore, MD

## 2016-07-20 ENCOUNTER — Ambulatory Visit: Payer: Medicaid Other | Admitting: Family Medicine

## 2016-07-27 ENCOUNTER — Other Ambulatory Visit: Payer: Self-pay | Admitting: Family Medicine

## 2016-07-29 ENCOUNTER — Other Ambulatory Visit: Payer: Self-pay | Admitting: Family Medicine

## 2016-07-29 NOTE — Telephone Encounter (Signed)
Seen 06/18/16

## 2016-08-26 ENCOUNTER — Other Ambulatory Visit: Payer: Self-pay | Admitting: Family Medicine

## 2016-10-12 ENCOUNTER — Other Ambulatory Visit: Payer: Self-pay | Admitting: Family Medicine

## 2016-11-15 ENCOUNTER — Other Ambulatory Visit: Payer: Self-pay | Admitting: Family Medicine

## 2016-11-26 ENCOUNTER — Other Ambulatory Visit: Payer: Self-pay | Admitting: Family Medicine

## 2016-12-29 ENCOUNTER — Other Ambulatory Visit: Payer: Self-pay | Admitting: Family Medicine

## 2016-12-29 NOTE — Telephone Encounter (Signed)
Pt is due for a f/u with dr Delton Seenelson, NS appt in April.

## 2016-12-30 ENCOUNTER — Telehealth: Payer: Self-pay | Admitting: Family Medicine

## 2016-12-30 ENCOUNTER — Encounter: Payer: Self-pay | Admitting: Family Medicine

## 2016-12-30 NOTE — Telephone Encounter (Signed)
Called patient to schedule appt per Dr.Nelson, no answer, left voicemail, and mailed a letter.

## 2017-01-10 ENCOUNTER — Other Ambulatory Visit: Payer: Self-pay | Admitting: Family Medicine

## 2017-01-13 ENCOUNTER — Ambulatory Visit: Payer: Self-pay | Admitting: Family Medicine

## 2017-01-14 ENCOUNTER — Ambulatory Visit: Payer: Self-pay | Admitting: Family Medicine

## 2017-01-14 ENCOUNTER — Telehealth: Payer: Self-pay | Admitting: Family Medicine

## 2017-01-14 ENCOUNTER — Other Ambulatory Visit: Payer: Self-pay | Admitting: Family Medicine

## 2017-01-14 MED ORDER — VENLAFAXINE HCL ER 37.5 MG PO CP24: 112.5000 mg | ORAL_CAPSULE | Freq: Every day | ORAL | 2 refills | Status: DC

## 2017-01-14 NOTE — Telephone Encounter (Signed)
Patient had appt today, arrived 13 minutes late, so she was rescheduled. The appt today was because she had a 'mishap' in the tub while hanging curtains and lost her effexor pills. She tried to get a refill, and one was sent from the office, but the insurance will not pay unless the dose is increased.

## 2017-01-17 ENCOUNTER — Ambulatory Visit: Payer: Medicaid Other | Admitting: Family Medicine

## 2017-01-21 ENCOUNTER — Other Ambulatory Visit: Payer: Self-pay | Admitting: Family Medicine

## 2017-01-21 NOTE — Telephone Encounter (Signed)
Spoke to pt, sch appt @ 1340, aware of last 2 no shows.

## 2017-02-03 ENCOUNTER — Other Ambulatory Visit: Payer: Self-pay | Admitting: Family Medicine

## 2017-02-11 ENCOUNTER — Other Ambulatory Visit: Payer: Self-pay | Admitting: Adult Health

## 2017-02-14 ENCOUNTER — Encounter: Payer: Self-pay | Admitting: Family Medicine

## 2017-02-14 ENCOUNTER — Ambulatory Visit (INDEPENDENT_AMBULATORY_CARE_PROVIDER_SITE_OTHER): Payer: Medicaid Other | Admitting: Family Medicine

## 2017-02-14 VITALS — BP 116/74 | HR 80 | Temp 97.0°F | Resp 16 | Ht 63.0 in | Wt 165.1 lb

## 2017-02-14 DIAGNOSIS — F429 Obsessive-compulsive disorder, unspecified: Secondary | ICD-10-CM | POA: Diagnosis not present

## 2017-02-14 DIAGNOSIS — F411 Generalized anxiety disorder: Secondary | ICD-10-CM | POA: Diagnosis not present

## 2017-02-14 DIAGNOSIS — M5416 Radiculopathy, lumbar region: Secondary | ICD-10-CM

## 2017-02-14 DIAGNOSIS — M4125 Other idiopathic scoliosis, thoracolumbar region: Secondary | ICD-10-CM | POA: Diagnosis not present

## 2017-02-14 DIAGNOSIS — Z23 Encounter for immunization: Secondary | ICD-10-CM

## 2017-02-14 MED ORDER — TIZANIDINE HCL 4 MG PO TABS: 4.0000 mg | ORAL_TABLET | Freq: Every day | ORAL | 3 refills | Status: DC

## 2017-02-14 MED ORDER — IBUPROFEN 800 MG PO TABS: 800.0000 mg | ORAL_TABLET | Freq: Three times a day (TID) | ORAL | 3 refills | Status: DC

## 2017-02-14 NOTE — Patient Instructions (Signed)
Referred to spine specialist Referred to psychologist Lab tests ordered for today  Take the ibuprofen 800 up to three times a day with food Take the tizanidine at night for muscle relaxer and sleep

## 2017-02-14 NOTE — Progress Notes (Signed)
Chief Complaint  Patient presents with  . Back Pain   She is here for follow-up.  She is here at my request because she no showed for her follow-up appointment with me earlier in the month.  Her last visit she was complaining of severe ongoing back pain.  I referred her to neurosurgery for a spine consultation, and she no showed for that appointment as well.  She continues to request ibuprofen 800 mg 3 times daily for pain, I refused to refill her medicine until she came in for a visit. Patient is out of her ibuprofen, and now in a lot of discomfort.  She has low back pain that radiates into her right leg on a daily basis.  This limits her ability to bend and lift.  She has a history of scoliosis.  No history of herniated disc.  Uncertain last x-ray or MRI.  Is requesting an MRI today.  She has not had any conservative treatment, exercise, or physical therapy.  I will place a referral for her scoliosis and chronic back pain. She also has anxiety.  Some depression.  She considers herself "OCD'.  She cannot go to bed unless all the housework is done.  She states that she has been feeling worse lately.  She has been on multiple psychiatric medicines in the past.  I recently increased her Effexor.  Because she is continuing to have OCD and anxiety symptoms, I recommend referral to psychiatry for consultation. She continues to smoke cigarettes.  I advised her again to quit.  I reminded her that smokers have more degenerative disc disease in the spine and a poor outcome to spine surgery and procedures.  She agrees to try to reduce cigarettes on her own.  Patient Active Problem List   Diagnosis Date Noted  . Tobacco abuse 06/18/2016  . Scoliosis 06/18/2016  . GAD (generalized anxiety disorder) 06/18/2016  . Substance abuse in remission (HCC) 06/18/2016    Outpatient Encounter Prescriptions as of 02/14/2017  Medication Sig  . ibuprofen (ADVIL,MOTRIN) 800 MG tablet Take 1 tablet (800 mg total) by  mouth 3 (three) times daily.  Marland Kitchen venlafaxine XR (EFFEXOR-XR) 37.5 MG 24 hr capsule Take 3 capsules (112.5 mg total) by mouth daily with breakfast.  . [DISCONTINUED] ibuprofen (ADVIL,MOTRIN) 800 MG tablet TAKE 1 TABLET(800 MG) BY MOUTH EVERY 8 HOURS AS NEEDED  . tiZANidine (ZANAFLEX) 4 MG tablet Take 1 tablet (4 mg total) by mouth at bedtime. May take one or two an hour before bed   No facility-administered encounter medications on file as of 02/14/2017.     No Known Allergies  Review of Systems  Constitutional: Negative for activity change, appetite change and unexpected weight change.  HENT: Negative for congestion, dental problem, postnasal drip and rhinorrhea.   Eyes: Negative for redness and visual disturbance.  Respiratory: Negative for cough and shortness of breath.   Cardiovascular: Negative for chest pain, palpitations and leg swelling.  Gastrointestinal: Negative for abdominal pain, constipation and diarrhea.  Genitourinary: Positive for pelvic pain. Negative for difficulty urinating, frequency and menstrual problem.       Chronic pelvic pain right lower quadrant.  Think she is in early menopause.  Has an appointment with GYN next month  Musculoskeletal: Positive for back pain. Negative for arthralgias.       Chronic back pain radiating into right leg  Neurological: Negative for dizziness and headaches.  Psychiatric/Behavioral: Positive for sleep disturbance. Negative for dysphoric mood. The patient is nervous/anxious.  Takes Benadryl at night to sleep    BP 116/74 (BP Location: Right Arm, Patient Position: Sitting, Cuff Size: Normal)   Pulse 80   Temp (!) 97 F (36.1 C) (Temporal)   Resp 16   Ht 5\' 3"  (1.6 m)   Wt 165 lb 1.9 oz (74.9 kg)   SpO2 98%   BMI 29.25 kg/m   Physical Exam  Constitutional: She is oriented to person, place, and time. She appears well-developed and well-nourished.  Anxious.  uncomfortable  HENT:  Head: Normocephalic and atraumatic.    Right Ear: External ear normal.  Left Ear: External ear normal.  Mouth/Throat: Oropharynx is clear and moist.  Eyes: Pupils are equal, round, and reactive to light. Conjunctivae are normal.  Neck: Normal range of motion. Neck supple. No thyromegaly present.  Cardiovascular: Normal rate, regular rhythm and normal heart sounds.   Pulmonary/Chest: Effort normal and breath sounds normal. No respiratory distress.  Abdominal: Soft. Bowel sounds are normal.  Musculoskeletal: Normal range of motion. She exhibits no edema.  Obvious lumbar scoliosis.  No tenderness to palpation.  Reflexes are 2+ and equal at the knee and ankle.  Straight leg raise is positive for leg pain at full extension on the right.  She can stand on her toes, some difficulty standing on her heels with weakness on the right side.  Sensation intact  Lymphadenopathy:    She has no cervical adenopathy.  Neurological: She is alert and oriented to person, place, and time.  Gait normal  Skin: Skin is warm and dry.  Psychiatric: Her behavior is normal. Thought content normal.  Nursing note and vitals reviewed.   ASSESSMENT/PLAN:  1. Need for influenza vaccination Done - Flu Vaccine QUAD 36+ mos IM  2. GAD (generalized anxiety disorder) Continue Effexor - Ambulatory referral to Psychiatry - COMPLETE METABOLIC PANEL WITH GFR - CBC  3. Obsessive-compulsive disorder, unspecified type Referred - Ambulatory referral to Psychiatry  4. Other idiopathic scoliosis, thoracolumbar region referred - Ambulatory referral to Spine Surgery  5. Lumbar back pain with radiculopathy affecting right lower extremity Discussed ibuprofen.  Stretching.  Ice.  Add tizanidine at bedtime.  Activity as tolerated. - Ambulatory referral to Spine Surgery   Patient Instructions  Referred to spine specialist Referred to psychologist Lab tests ordered for today  Take the ibuprofen 800 up to three times a day with food Take the tizanidine at night  for muscle relaxer and sleep      Eustace MooreYvonne Sue Tramane Gorum, MD

## 2017-02-15 ENCOUNTER — Ambulatory Visit: Payer: Medicaid Other | Admitting: Family Medicine

## 2017-02-15 ENCOUNTER — Encounter: Payer: Self-pay | Admitting: Family Medicine

## 2017-02-15 LAB — COMPLETE METABOLIC PANEL WITH GFR
AG Ratio: 1.5 (calc) (ref 1.0–2.5)
ALT: 9 U/L (ref 6–29)
AST: 13 U/L (ref 10–30)
Albumin: 4.1 g/dL (ref 3.6–5.1)
Alkaline phosphatase (APISO): 64 U/L (ref 33–115)
BILIRUBIN TOTAL: 0.3 mg/dL (ref 0.2–1.2)
BUN: 15 mg/dL (ref 7–25)
CHLORIDE: 105 mmol/L (ref 98–110)
CO2: 28 mmol/L (ref 20–32)
Calcium: 9.3 mg/dL (ref 8.6–10.2)
Creat: 0.79 mg/dL (ref 0.50–1.10)
GFR, EST AFRICAN AMERICAN: 109 mL/min/{1.73_m2} (ref >=60)
GFR, EST NON AFRICAN AMERICAN: 94 mL/min/{1.73_m2} (ref >=60)
GLUCOSE: 78 mg/dL (ref 65–139)
Globulin: 2.7 g/dL (calc) (ref 1.9–3.7)
POTASSIUM: 4.5 mmol/L (ref 3.5–5.3)
SODIUM: 140 mmol/L (ref 135–146)
TOTAL PROTEIN: 6.8 g/dL (ref 6.1–8.1)

## 2017-02-15 LAB — CBC
HEMATOCRIT: 32.5 % — AB (ref 35.0–45.0)
HEMOGLOBIN: 10.9 g/dL — AB (ref 11.7–15.5)
MCH: 29.8 pg (ref 27.0–33.0)
MCHC: 33.5 g/dL (ref 32.0–36.0)
MCV: 88.8 fL (ref 80.0–100.0)
MPV: 10.2 fL (ref 7.5–12.5)
Platelets: 390 10*3/uL (ref 140–400)
RBC: 3.66 10*6/uL — AB (ref 3.80–5.10)
RDW: 13.3 % (ref 11.0–15.0)
WBC: 6.5 10*3/uL (ref 3.8–10.8)

## 2017-03-01 ENCOUNTER — Encounter: Payer: Self-pay | Admitting: Adult Health

## 2017-03-01 ENCOUNTER — Telehealth: Payer: Self-pay | Admitting: Family Medicine

## 2017-03-01 ENCOUNTER — Ambulatory Visit: Payer: Medicaid Other | Admitting: Adult Health

## 2017-03-01 ENCOUNTER — Other Ambulatory Visit (HOSPITAL_COMMUNITY)
Admission: RE | Admit: 2017-03-01 | Discharge: 2017-03-01 | Disposition: A | Discharge status: Home / Self Care | Payer: Medicaid Other | Source: Ambulatory Visit | Attending: Adult Health | Admitting: Adult Health

## 2017-03-01 ENCOUNTER — Other Ambulatory Visit: Payer: Self-pay

## 2017-03-01 VITALS — BP 112/60 | HR 81 | Resp 18 | Ht 63.0 in | Wt 165.0 lb

## 2017-03-01 DIAGNOSIS — R232 Flushing: Secondary | ICD-10-CM | POA: Diagnosis not present

## 2017-03-01 DIAGNOSIS — R1031 Right lower quadrant pain: Secondary | ICD-10-CM

## 2017-03-01 DIAGNOSIS — Z01419 Encounter for gynecological examination (general) (routine) without abnormal findings: Secondary | ICD-10-CM | POA: Diagnosis not present

## 2017-03-01 DIAGNOSIS — Z0001 Encounter for general adult medical examination with abnormal findings: Secondary | ICD-10-CM | POA: Diagnosis not present

## 2017-03-01 DIAGNOSIS — Z01411 Encounter for gynecological examination (general) (routine) with abnormal findings: Secondary | ICD-10-CM

## 2017-03-01 DIAGNOSIS — Z124 Encounter for screening for malignant neoplasm of cervix: Secondary | ICD-10-CM

## 2017-03-01 DIAGNOSIS — N926 Irregular menstruation, unspecified: Secondary | ICD-10-CM

## 2017-03-01 NOTE — Telephone Encounter (Signed)
May pend Flexeril 5 mg to take at bedtime #30 0 refills Forward chart to Dusty to see about spine appointment

## 2017-03-01 NOTE — Progress Notes (Signed)
Patient ID: Fredia SorrowMelissa Purpura, female   DOB: Mar 25, 1976, 41 y.o.   MRN: [id number] History of Present Illness:  Efraim KaufmannMelissa is a 41 year old Hispanic female, married in for well woman gyn exam and pap and is complaining of irregular periods and burning pain in right lower quadrant.She had 4 C sections and had tubal.  PCP is Dr Delton SeeNelson.   Current Medications, Allergies, Past Medical History, Past Surgical History, Family History and Social History were reviewed [facility] electronic [facility] record.     Review of Systems:  Patient denies any headaches, hearing loss, fatigue, blurred vision, shortness of breath, chest pain,  problems with bowel movements, urination, or intercourse. No joint pain or mood swings. Has RLQ burning/pain for over 2 months, and periods are irregular,coming every 2-3 weeks and +hot flashes,has lost 15 -20 lbs in last 6 months.   Physical Exam:BP 112/60 (BP Location: Right Arm, Patient Position: Sitting, Cuff Size: Normal)   Pulse 81   Resp 18   Ht 5\' 3"  (1.6 m)   Wt 165 lb (74.8 kg)   LMP 02/17/2017 (Approximate)   BMI 29.23 kg/m  General:  Well developed, well nourished, no acute distress Skin:  Warm and dry Neck:  Midline trachea, normal thyroid, good ROM, no lymphadenopathy Lungs; Clear to auscultation bilaterally Breast:  No dominant palpable mass, retraction, or nipple discharge Cardiovascular: Regular rate and rhythm Abdomen:  Soft, non tender, no hepatosplenomegaly Pelvic:  External genitalia is normal in appearance, no lesions.  The vagina is normal in appearance. Urethra has no lesions or masses. The cervix is smooth, pap with GC/CHL and HPV performed  Uterus is felt to be normal size, shape, and contour.  No adnexal masses, +RLQ tenderness and fullness noted.Bladder is non tender, no masses felt. Extremities/musculoskeletal:  No swelling or varicosities noted, no clubbing or cyanosis Psych:  No mood changes, alert and cooperative,seems happy PHQ 2  score 0.  Impression: 1. Encounter for routine gynecological examination with Papanicolaou smear of cervix   2. Abdominal pain, RLQ   3. Irregular periods   4. Hot flashes       Plan: Return in 1 week for GYN US Physical in 1 year Pap in 3 if normal Mammogram at 42  Labs with PCP Review handout

## 2017-03-01 NOTE — Addendum Note (Signed)
Addended by: Tish FredericksonLANCASTER, Latrisha Coiro A on: 03/01/2017 02:42 PM   Modules accepted: Orders

## 2017-03-01 NOTE — Patient Instructions (Signed)
Pelvic Pain, Female Pelvic pain is pain in your lower abdomen, below your belly button and between your hips. The pain may start suddenly (acute), keep coming back (recurring), or last a long time (chronic). Pelvic pain that lasts longer than six months is considered chronic. Pelvic pain may affect your:  Reproductive organs.  Urinary system.  Digestive tract.  Musculoskeletal system.  There are many potential causes of pelvic pain. Sometimes, the pain can be a result of digestive or urinary conditions, strained muscles or ligaments, or even reproductive conditions. Sometimes the cause of pelvic pain is not known. Follow these instructions at home:  Take over-the-counter and prescription medicines only as told by your health care provider.  Rest as told by your health care provider.  Do not have sex it if hurts.  Keep a journal of your pelvic pain. Write down: ? When the pain started. ? Where the pain is located. ? What seems to make the pain better or worse, such as food or your menstrual cycle. ? Any symptoms you have along with the pain.  Keep all follow-up visits as told by your health care provider. This is important. Contact a health care provider if:  Medicine does not help your pain.  Your pain comes back.  You have new symptoms.  You have abnormal vaginal discharge or bleeding, including bleeding after menopause.  You have a fever or chills.  You are constipated.  You have blood in your urine or stool.  You have foul-smelling urine.  You feel weak or lightheaded. Get help right away if:  You have sudden severe pain.  Your pain gets steadily worse.  You have severe pain along with fever, nausea, vomiting, or excessive sweating.  You lose consciousness. This information is not intended to replace advice given to you by your health care provider. Make sure you discuss any questions you have with your health care provider. Document Released: 03/02/2004  Document Revised: 04/30/2015 Document Reviewed: 01/24/2015 Elsevier Interactive Patient Education  2018 Elsevier Inc.  

## 2017-03-01 NOTE — Telephone Encounter (Signed)
Patient left message on nurse line regarding tizanidine. She is requesting a different medication for muscle spasms. She states she is having a lot of pain and the tizanidine is not helping.  Callback# [phone]

## 2017-03-02 ENCOUNTER — Telehealth: Payer: Self-pay | Admitting: Family Medicine

## 2017-03-02 MED ORDER — CYCLOBENZAPRINE HCL 5 MG PO TABS: 5.0000 mg | ORAL_TABLET | Freq: Every day | ORAL | 0 refills | Status: DC

## 2017-03-02 NOTE — Telephone Encounter (Signed)
Faxed 02/15/17, will call for update.

## 2017-03-02 NOTE — Telephone Encounter (Signed)
Yes, I know, but I placed a new referral Oct 29th.

## 2017-03-02 NOTE — Telephone Encounter (Signed)
Appointment was made for 07-06-16 with Dr. Lovell SheehanJenkins. Patient was a no show.

## 2017-03-02 NOTE — Telephone Encounter (Signed)
Referral update:  Becky from Dr.Jenkins office called back to let me know that patients chart was in review with Dr.Jenkins due to her no show appt in march 2018

## 2017-03-03 LAB — CYTOLOGY - PAP
Chlamydia: NEGATIVE
DIAGNOSIS: NEGATIVE
HPV (WINDOPATH): NOT DETECTED
NEISSERIA GONORRHEA: NEGATIVE

## 2017-03-07 ENCOUNTER — Other Ambulatory Visit: Payer: Medicaid Other

## 2017-03-08 ENCOUNTER — Ambulatory Visit (INDEPENDENT_AMBULATORY_CARE_PROVIDER_SITE_OTHER): Payer: Medicaid Other

## 2017-03-08 DIAGNOSIS — N926 Irregular menstruation, unspecified: Secondary | ICD-10-CM | POA: Diagnosis not present

## 2017-03-08 DIAGNOSIS — R1031 Right lower quadrant pain: Secondary | ICD-10-CM | POA: Diagnosis not present

## 2017-03-08 NOTE — Progress Notes (Signed)
PELVIC US TA/TV: homogeneous anteverted uterus,wnl,EEC 8 mm,normal left ovary,simple right ovarian cyst 2.5 x 1.6 x 1.8 cm,no free fluid,ovaries appear mobile,no pain during ultrasound

## 2017-03-09 ENCOUNTER — Encounter: Payer: Self-pay | Admitting: Adult Health

## 2017-03-09 ENCOUNTER — Telehealth: Payer: Self-pay | Admitting: Adult Health

## 2017-03-09 DIAGNOSIS — N83201 Unspecified ovarian cyst, right side: Secondary | ICD-10-CM

## 2017-03-09 DIAGNOSIS — N83209 Unspecified ovarian cyst, unspecified side: Secondary | ICD-10-CM

## 2017-03-09 HISTORY — DX: Unspecified ovarian cyst, unspecified side: N83.209

## 2017-03-09 NOTE — Telephone Encounter (Signed)
Left message that has cyst on right ovary, left ovary and uterus normal.

## 2017-03-09 NOTE — Telephone Encounter (Signed)
Pt aware that US showed right ovarian cyst, will rx Micronor,she smokes,  and recheck US in 6 weeks

## 2017-03-15 ENCOUNTER — Other Ambulatory Visit: Payer: Self-pay | Admitting: Family Medicine

## 2017-03-15 NOTE — Telephone Encounter (Signed)
May give 10 mg

## 2017-03-15 NOTE — Telephone Encounter (Signed)
Called and spoke to Sharon Springsmelissa, this was written 13 days ago for # 30. She states she is taking 10 mg @ hs, is asking for a dose increase?

## 2017-03-16 MED ORDER — CYCLOBENZAPRINE HCL 10 MG PO TABS: 10.0000 mg | ORAL_TABLET | Freq: Every evening | ORAL | 0 refills | Status: DC | PRN

## 2017-03-17 ENCOUNTER — Telehealth: Payer: Self-pay | Admitting: Adult Health

## 2017-03-17 MED ORDER — NORETHINDRONE 0.35 MG PO TABS: 1.0000 | ORAL_TABLET | Freq: Every day | ORAL | 11 refills | Status: DC

## 2017-03-17 NOTE — Telephone Encounter (Signed)
Pt aware [facility]or at [facility]

## 2017-03-23 NOTE — Progress Notes (Deleted)
Psychiatric Initial Adult Assessment   Patient Identification: Lindsey Parker MRN:  [id number] Date of Evaluation:  03/23/2017 Referral Source: Dr. Rica MastNelson Yvonne Chief Complaint:   Visit Diagnosis: No diagnosis found.  History of Present Illness:   Lindsey SorrowMelissa Parker is a 41 year old female with anxiety, OCD, history of substance use disorder per chart, lumar back pain with radiculopathy, who is referred for anxiety, OCD  Associated Signs/Symptoms: Depression Symptoms:  {DEPRESSION SYMPTOMS:20000} (Hypo) Manic Symptoms:  {BHH MANIC SYMPTOMS:22872} Anxiety Symptoms:  {BHH ANXIETY SYMPTOMS:22873} Psychotic Symptoms:  {BHH PSYCHOTIC SYMPTOMS:22874} PTSD Symptoms: {BHH PTSD SYMPTOMS:22875}  Past Psychiatric History:  Outpatient:  Psychiatry admission:  Previous suicide attempt:  Past trials of medication:  History of violence:   Previous Psychotropic Medications: {YES/NO:21197}  Substance Abuse History in the last 12 months:  {yes no:314532}  Consequences of Substance Abuse: {BHH CONSEQUENCES OF SUBSTANCE ABUSE:22880}  Past Medical History:  Past Medical History:  Diagnosis Date  . Abnormal Pap smear of cervix    cone, cryo - age 41  . Anxiety   . GERD (gastroesophageal reflux disease)   . Ovarian cyst 03/09/2017  . Substance abuse (HCC)    over 10 years    Past Surgical History:  Procedure Laterality Date  . CESAREAN SECTION     x4  . TUBAL LIGATION      Family Psychiatric History: ***  Family History:  Family History  Problem Relation Age of Onset  . Miscarriages / [place] Mother   . Drug abuse Father   . Hypertension Father   . Kidney disease Father   . Cancer Father        kidney  . Stroke Maternal Grandmother   . Diabetes Maternal Grandmother   . Asthma Brother   . Cancer Maternal Aunt        breast    Social History:   Social History   Socioeconomic History  . Marital status: Single    Spouse name: BF-Christian  . Number of children: 4   . Years of education: 6816  . Highest education level: Not on file  Social Needs  . Financial resource strain: Not on file  . Food insecurity - worry: Not on file  . Food insecurity - inability: Not on file  . Transportation needs - medical: Not on file  . Transportation needs - non-medical: Not on file  Occupational History  . 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 Occupation: LPN by training  Tobacco Use  . Smoking status: Current Every Day Smoker    Packs/day: 0.15    Types: Cigarettes    Start date: 04/19/1993  . Smokeless tobacco: Never Used  . Tobacco comment: 2-3 a day  Substance and Sexual Activity  . Alcohol use: No  . Drug use: Yes    Types: Marijuana    Comment: 1-2 times a week  . Sexual activity: Yes    Birth control/protection: Surgical    Comment: tubal  Other Topics Concern  . Not on file  Social History Narrative   Has had training in massage therapy and nails   Is an LPN      Currently self employed with BF as handyman      Lives with Rosiland OzChristian    Ryan 11   Christian 2    Additional Social History: ***  Allergies:  No Known Allergies  Metabolic Disorder Labs: No results found for: HGBA1C, MPG No results found for: PROLACTIN No results found for:  CHOL, TRIG, HDL, CHOLHDL, VLDL, LDLCALC   Current Medications: Current Outpatient Medications  Medication Sig Dispense Refill  . cyclobenzaprine (FLEXERIL) 10 MG tablet Take 1 tablet (10 mg total) by mouth at bedtime as needed for muscle spasms. 30 tablet 0  . cyclobenzaprine (FLEXERIL) 5 MG tablet Take 1 tablet (5 mg total) at bedtime by mouth. 30 tablet 0  . ibuprofen (ADVIL,MOTRIN) 800 MG tablet Take 1 tablet (800 mg total) by mouth 3 (three) times daily. 90 tablet 3  . norethindrone (MICRONOR,CAMILA,ERRIN) 0.35 MG tablet Take 1 tablet (0.35 mg total) by mouth daily. 1 Package 11  . tiZANidine (ZANAFLEX) 4 MG tablet Take 1 tablet (4 mg total) by mouth at bedtime. May take one  or two an hour before bed (Patient not taking: Reported on 03/01/2017) 60 tablet 3  . venlafaxine XR (EFFEXOR-XR) 37.5 MG 24 hr capsule Take 3 capsules (112.5 mg total) by mouth daily with breakfast. 90 capsule 2   No current facility-administered medications for this visit.     Neurologic: Headache: No Seizure: No Paresthesias:No  Musculoskeletal: Strength & Muscle Tone: within normal limits Gait & Station: normal Patient leans: N/A  Psychiatric Specialty Exam: ROS  There were no vitals taken for this visit.There is no height or weight on file to calculate BMI.  General Appearance: Fairly Groomed  Eye Contact:  Good  Speech:  Clear and Coherent  Volume:  Normal  Mood:  {BHH MOOD:22306}  Affect:  {Affect (PAA):22687}  Thought Process:  Coherent and Goal Directed  Orientation:  Full (Time, Place, and Person)  Thought Content:  Logical  Suicidal Thoughts:  {ST/HT (PAA):22692}  Homicidal Thoughts:  {ST/HT (PAA):22692}  Memory:  Immediate;   Good Recent;   Good Remote;   Good  Judgement:  {Judgement (PAA):22694}  Insight:  {Insight (PAA):22695}  Psychomotor Activity:  Normal  Concentration:  Concentration: Good and Attention Span: Good  Recall:  Good  Fund of Knowledge:Good  Language: Good  Akathisia:  No  Handed:  Right  AIMS (if indicated):  ***  Assets:  Communication Skills Desire for Improvement  ADL's:  Intact  Cognition: WNL  Sleep:  ***   Assessment  Plan  The patient demonstrates the following risk factors for suicide: Chronic risk factors for suicide include: {Chronic Risk Factors for UJWJXBJ:47829562}Suicide:30414011}. Acute risk factors for suicide include: {Acute Risk Factors for ZHYQMVH:84696295}Suicide:30414012}. Protective factors for this patient include: {Protective Factors for Suicide MWUX:32440102}Risk:30414013}. Considering these factors, the overall suicide risk at this point appears to be {Desc; low/moderate/high:110033}. Patient {ACTION; IS/IS VOZ:36644034}OT:21021397} appropriate for outpatient follow  up.   Treatment Plan Summary: Plan as above   Neysa Hottereina Lilyann Gravelle, MD 12/5/20183:10 PM

## 2017-03-25 ENCOUNTER — Other Ambulatory Visit: Payer: Self-pay | Admitting: Family Medicine

## 2017-03-25 NOTE — Telephone Encounter (Signed)
Seen [phone] 

## 2017-03-28 ENCOUNTER — Ambulatory Visit (HOSPITAL_COMMUNITY): Payer: Medicaid Other | Admitting: Psychiatry

## 2017-04-20 ENCOUNTER — Other Ambulatory Visit: Payer: Medicaid Other

## 2017-04-26 ENCOUNTER — Encounter: Payer: Self-pay | Admitting: Family Medicine

## 2017-04-26 ENCOUNTER — Telehealth: Payer: Self-pay

## 2017-04-26 NOTE — Telephone Encounter (Signed)
Pt no show x 2 can not reschedule - Dr Lovell SheehanJenkins - [facility]

## 2017-04-26 NOTE — Telephone Encounter (Signed)
I sent her a noncompliance letter

## 2017-05-02 ENCOUNTER — Ambulatory Visit: Payer: Medicaid Other | Admitting: Family Medicine

## 2017-05-17 ENCOUNTER — Ambulatory Visit: Payer: Medicaid Other | Admitting: Family Medicine

## 2017-05-30 ENCOUNTER — Telehealth: Payer: Self-pay

## 2017-05-30 NOTE — Telephone Encounter (Signed)
Pt called upset that she is no longer a pt of this office due to no-shows.  She said it was unfair and I told her when Dr Delton SeeNelson decides to dismiss a patient she will not take the patient back. She asked could she see another doctor and I told her Dr Del[facility]Nelson [facility] once she has dismissed the patient.

## 2017-07-23 ENCOUNTER — Other Ambulatory Visit: Payer: Self-pay | Admitting: Family Medicine

## 2017-11-13 ENCOUNTER — Emergency Department (HOSPITAL_COMMUNITY)
Admission: EM | Admit: 2017-11-13 | Discharge: 2017-11-13 | Disposition: A | Discharge status: Home Health | Payer: Medicaid Other | Attending: Emergency Medicine | Admitting: Emergency Medicine

## 2017-11-13 ENCOUNTER — Emergency Department (HOSPITAL_COMMUNITY): Payer: Medicaid Other

## 2017-11-13 ENCOUNTER — Encounter (HOSPITAL_COMMUNITY): Payer: Self-pay | Admitting: Emergency Medicine

## 2017-11-13 DIAGNOSIS — Z79899 Other long term (current) drug therapy: Secondary | ICD-10-CM | POA: Insufficient documentation

## 2017-11-13 DIAGNOSIS — Z8739 Personal history of other diseases of the musculoskeletal system and connective tissue: Secondary | ICD-10-CM | POA: Insufficient documentation

## 2017-11-13 DIAGNOSIS — M25561 Pain in right knee: Secondary | ICD-10-CM | POA: Insufficient documentation

## 2017-11-13 DIAGNOSIS — F17210 Nicotine dependence, cigarettes, uncomplicated: Secondary | ICD-10-CM | POA: Insufficient documentation

## 2017-11-13 HISTORY — DX: Sciatica, unspecified side: M54.30

## 2017-11-13 HISTORY — DX: Scoliosis, unspecified: M41.9

## 2017-11-13 MED ORDER — PREDNISONE 10 MG PO TABS: ORAL_TABLET | ORAL | 0 refills | Status: DC

## 2017-11-13 MED ORDER — DICLOFENAC SODIUM 1 % TD GEL: 4.0000 g | Freq: Four times a day (QID) | TRANSDERMAL | 0 refills | Status: DC

## 2017-11-13 MED ORDER — IBUPROFEN 800 MG PO TABS: 800.0000 mg | ORAL_TABLET | Freq: Three times a day (TID) | ORAL | 0 refills | Status: DC

## 2017-11-13 NOTE — ED Triage Notes (Signed)
Pt reports right knee pain for 2 weeks.

## 2017-11-13 NOTE — ED Provider Notes (Signed)
[facility] Provider Note   CSN: [id number] Arrival date & time: 11/13/17  [id number]     History   Chief Complaint Chief Complaint  Patient presents with  . Knee Pain    HPI Lindsey Parker is a 42 y.o. female presenting with a 2 week history of right knee pain. She denies any obvious injury but has had problems with lower extremity pain in the past as a sequalae of her severe scoliosis due to altered gait at baseline.  She does work in [employer] and has been kneeling with her job lately.  She denies radiation of pain which is deep at aching.  She has a brother who develop osteonecrosis for no unexplained reason and is concerned about this possibility. She denies fevers, chills, rash, no recent flu like symptoms. No swelling of her knee joint.  She is taking ibuprofen chronically for her back without significant relief.   The history is provided by the patient.    Past Medical History:  Diagnosis Date  . Abnormal Pap smear of cervix    cone, cryo - age 42  . Anxiety   . GERD (gastroesophageal reflux disease)   . Ovarian cyst 03/09/2017  . Sciatica   . Scoliosis   . Substance abuse (HCC)    over 10 years    Patient Active Problem List   Diagnosis Date Noted  . Ovarian cyst 03/09/2017  . Tobacco abuse 06/18/2016  . Scoliosis 06/18/2016  . GAD (generalized anxiety disorder) 06/18/2016  . Substance abuse in remission (HCC) 06/18/2016    Past Surgical History:  Procedure Laterality Date  . CESAREAN SECTION     x4  . TUBAL LIGATION       OB History    Gravida  5   Para  4   Term      Preterm      AB  1   Living  4     SAB  1   TAB      Ectopic      Multiple      Live Births               Home Medications    Prior to Admission medications   Medication Sig Start Date End Date Taking? Authorizing Provider  cyclobenzaprine (FLEXERIL) 10 MG tablet Take 1 tablet (10 mg total) by mouth at bedtime as needed for muscle spasms.  03/16/17   Eustace MooreNelson, Yvonne Sue, MD  cyclobenzaprine (FLEXERIL) 5 MG tablet Take 1 tablet (5 mg total) at bedtime by mouth. 03/02/17   Eustace MooreNelson, Yvonne Sue, MD  diclofenac sodium (VOLTAREN) 1 % GEL Apply 4 g topically 4 (four) times daily. 11/13/17   Burgess AmorIdol, Melody Cirrincione, PA-C  ibuprofen (ADVIL,MOTRIN) 800 MG tablet Take 1 tablet (800 mg total) by mouth 3 (three) times daily. 11/19/17   Burgess AmorIdol, Malek Skog, PA-C  norethindrone (MICRONOR,CAMILA,ERRIN) 0.35 MG tablet Take 1 tablet (0.35 mg total) by mouth daily. 03/17/17   Adline PotterGriffin, Jennifer A, NP  predniSONE (DELTASONE) 10 MG tablet Take 6 tablets day one, 5 tablets day two, 4 tablets day three, 3 tablets day four, 2 tablets day five, then 1 tablet day six 11/13/17   Dasie Chancellor, Raynelle FanningJulie, PA-C  tiZANidine (ZANAFLEX) 4 MG tablet Take 1 tablet (4 mg total) by mouth at bedtime. May take one or two an hour before bed Patient not taking: Reported on 03/01/2017 02/14/17   Eustace MooreNelson, Yvonne Sue, MD  venlafaxine XR (EFFEXOR-XR) 37.5 MG 24 hr capsule TAKE 3 CAPSULES(112.5  MG) BY MOUTH DAILY WITH BREAKFAST 03/25/17   Eustace Moore, MD    Family History Family History  Problem Relation Age of Onset  . Miscarriages / [place] Mother   . Drug abuse Father   . Hypertension Father   . Kidney disease Father   . Cancer Father        kidney  . Stroke Maternal Grandmother   . Diabetes Maternal Grandmother   . Asthma Brother   . Cancer Maternal Aunt        breast    Social History Social History   Tobacco Use  . Smoking status: Current Every Day Smoker    Packs/day: 0.15    Types: Cigarettes    Start date: 04/19/1993  . Smokeless tobacco: Never Used  . Tobacco comment: 2-3 a day  Substance Use Topics  . Alcohol use: No  . Drug use: Yes    Types: Marijuana    Comment: 1-2 times a week     Allergies   Patient has no known allergies.   Review of Systems Review of Systems  Constitutional: Negative for fever.  Musculoskeletal: Positive for arthralgias. Negative for joint  swelling and myalgias.  Neurological: Negative for weakness and numbness.     Physical Exam Updated Vital Signs BP 104/67 (BP Location: Left Arm)   Pulse 68   Temp 97.6 F (36.4 C) (Oral)   Resp 18   Ht 5\' 3"  (1.6 m)   Wt 74.8 kg (165 lb)   LMP 10/30/2017   SpO2 100%   BMI 29.23 kg/m   Physical Exam  Constitutional: She appears well-developed and well-nourished.  HENT:  Head: Atraumatic.  Neck: Normal range of motion.  Cardiovascular:  Pulses equal bilaterally  Musculoskeletal: She exhibits tenderness.       Right knee: She exhibits bony tenderness. She exhibits normal range of motion, no swelling, no effusion, no ecchymosis, no deformity, no erythema, normal alignment, no LCL laxity, normal patellar mobility and no MCL laxity.  ttp along anterior medial and lateral meniscus.  No crepitus or palpable deformity.  Pain with posterior drawer, no laxity.   Neurological: She is alert. She has normal strength. She displays normal reflexes. No sensory deficit.  Skin: Skin is warm and dry.  Psychiatric: She has a normal mood and affect.     ED Treatments / Results  Labs (all labs ordered are listed, but only abnormal results are displayed) Labs Reviewed - No data to display  EKG None  Radiology Dg Knee Complete 4 Views Right  Result Date: 11/13/2017 CLINICAL DATA:  Right knee pain for 2 months, no known injury, initial encounter EXAM: RIGHT KNEE - COMPLETE 4+ VIEW COMPARISON:  None. FINDINGS: No evidence of fracture, dislocation, or joint effusion. No evidence of arthropathy or other focal bone abnormality. Soft tissues are unremarkable. IMPRESSION: No acute abnormality noted. Electronically Signed   By: Alcide Clever M.D.   On: 11/13/2017 08:38    Procedures Procedures (including critical care time)  Medications Ordered in ED Medications - No data to display   Initial Impression / Assessment and Plan / ED Course  I have reviewed the triage vital signs and the nursing  notes.  Pertinent labs & imaging results that were available during my care of the patient were reviewed by me and considered in my medical decision making (see chart for details).     Pt with normal imaging, reviewed and discussed with pt. She was given knee sleeve, prescribed prednisone taper with  voltaren gel.  She also requested ibuprofen 800 mg which was prescribed but advised to start this after prednisone competed if needed.  Referral to ortho for f/u care. Suspect soft tissue, possible meniscal source of pain. No obvious trauma.   Final Clinical Impressions(s) / ED Diagnoses   Final diagnoses:  Acute pain of right knee    ED Discharge Orders        Ordered    ibuprofen (ADVIL,MOTRIN) 800 MG tablet  3 times daily     11/13/17 0924    predniSONE (DELTASONE) 10 MG tablet     11/13/17 0924    diclofenac sodium (VOLTAREN) 1 % GEL  4 times daily     11/13/17 0924       Burgess Amor, PA-C 11/13/17 1008    Samuel Jester, DO 11/16/17 2119

## 2017-11-13 NOTE — Discharge Instructions (Addendum)
Your xray is completely normal in appearance suggesting your pain may be due to a soft tissue problem (ligament inflammation or  cartilage injury) that will not show up on plain xrays.  You do not have any evidence of osteonecrosis, fractures, or arthritis.  Try the voltaren gel along with the prednisone taper initially.  Once the prednisone taper is completed you can start taking the prescription strength ibuprofen (taking prednisone and ibuprofen together can cause worsened acid reflux symptoms).  Apply a heating pad several times daily.

## 2017-12-28 ENCOUNTER — Encounter: Payer: Self-pay | Admitting: Orthopaedic Surgery

## 2017-12-28 ENCOUNTER — Ambulatory Visit: Payer: Medicaid Other | Admitting: Orthopaedic Surgery

## 2017-12-28 VITALS — BP 116/76 | HR 76 | Ht 62.0 in | Wt 173.0 lb

## 2017-12-28 DIAGNOSIS — M25551 Pain in right hip: Secondary | ICD-10-CM | POA: Diagnosis not present

## 2017-12-28 DIAGNOSIS — G8929 Other chronic pain: Secondary | ICD-10-CM | POA: Diagnosis not present

## 2017-12-28 DIAGNOSIS — M4120 Other idiopathic scoliosis, site unspecified: Secondary | ICD-10-CM

## 2017-12-28 DIAGNOSIS — M25561 Pain in right knee: Secondary | ICD-10-CM | POA: Diagnosis not present

## 2017-12-28 NOTE — Progress Notes (Signed)
Subjective:    Patient ID: Lindsey Parker, female    DOB: May 27, 1975, 42 y.o.   MRN: [id number]  HPI She has pain in the right knee with swelling and popping and giving way.  This has been going on since July. She had x-rays done 11-13-17 which were negative.  The knee pain has continued. She has seen her family doctor.  She has not responded to NSAIDs, rest, ice, rubs, heat.  She limps now.  Her swelling comes and goes.  She has no trauma, no redness, no numbness.  She has a significant history of scoliosis of the spine.  She last had X-rays done about 8 to 10 years ago.  She says she has a tilt of the pelvis and leg length inequality.  I will get x-rays of the spine and leg length films with "stitching" technique done digitally at the hospital xray department.  I will also get MRI of the knee on the right as I am concerned about a medial meniscus tear. She may need surgery.  Review of Systems  Constitutional: Positive for activity change.  Musculoskeletal: Positive for arthralgias, back pain, gait problem and joint swelling.  All other systems reviewed and are negative.  For Review of Systems, all other systems reviewed and are negative.  The following is a summary of the past history medically, past history surgically, known current medicines, social history and family history.  This information is gathered electronically by the computer from prior information and documentation.  I review this each visit and have found including this information at this point in the chart is beneficial and informative.   Past Medical History:  Diagnosis Date  . Abnormal Pap smear of cervix    cone, cryo - age 84  . Anxiety   . GERD (gastroesophageal reflux disease)   . Ovarian cyst 03/09/2017  . Sciatica   . Scoliosis   . Substance abuse (HCC)    over 10 years    Past Surgical History:  Procedure Laterality Date  . CESAREAN SECTION     x4  . TUBAL LIGATION      Current Outpatient  Medications on File Prior to Visit  Medication Sig Dispense Refill  . cyclobenzaprine (FLEXERIL) 10 MG tablet Take 1 tablet (10 mg total) by mouth at bedtime as needed for muscle spasms. (Patient not taking: Reported on 12/28/2017) 30 tablet 0  . cyclobenzaprine (FLEXERIL) 5 MG tablet Take 1 tablet (5 mg total) at bedtime by mouth. (Patient not taking: Reported on 12/28/2017) 30 tablet 0  . diclofenac sodium (VOLTAREN) 1 % GEL Apply 4 g topically 4 (four) times daily. 100 g 0  . ibuprofen (ADVIL,MOTRIN) 800 MG tablet Take 1 tablet (800 mg total) by mouth 3 (three) times daily. 30 tablet 0  . norethindrone (MICRONOR,CAMILA,ERRIN) 0.35 MG tablet Take 1 tablet (0.35 mg total) by mouth daily. (Patient not taking: Reported on 12/28/2017) 1 Package 11  . predniSONE (DELTASONE) 10 MG tablet Take 6 tablets day one, 5 tablets day two, 4 tablets day three, 3 tablets day four, 2 tablets day five, then 1 tablet day six 21 tablet 0  . tiZANidine (ZANAFLEX) 4 MG tablet Take 1 tablet (4 mg total) by mouth at bedtime. May take one or two an hour before bed (Patient not taking: Reported on 03/01/2017) 60 tablet 3  . venlafaxine XR (EFFEXOR-XR) 37.5 MG 24 hr capsule TAKE 3 CAPSULES(112.5 MG) BY MOUTH DAILY WITH BREAKFAST (Patient not taking: Reported on 12/28/2017) 90 capsule  5   No current facility-administered medications on file prior to visit.     Social History   Socioeconomic History  . Marital status: Single    Spouse name: BF-Christian  . Number of children: 4  . Years of education: 93  . Highest education level: Not on file  Occupational History  . 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 Occupation: LPN by training  Social Needs  . Financial resource strain: Not on file  . Food insecurity:    Worry: Not on file    Inability: Not on file  . Transportation needs:    Medical: Not on file    Non-medical: Not on file  Tobacco Use  . Smoking status: Current Every Day Smoker     Packs/day: 0.15    Types: Cigarettes    Start date: 04/19/1993  . Smokeless tobacco: Never Used  . Tobacco comment: 2-3 a day  Substance and Sexual Activity  . Alcohol use: No  . Drug use: Yes    Types: Marijuana    Comment: 1-2 times a week  . Sexual activity: Yes    Birth control/protection: Surgical    Comment: tubal  Lifestyle  . Physical activity:    Days per week: Not on file    Minutes per session: Not on file  . Stress: Not on file  Relationships  . Social connections:    Talks on phone: Not on file    Gets together: Not on file    Attends religious service: Not on file    Active member of club or organization: Not on file    Attends meetings of clubs or organizations: Not on file    Relationship status: Not on file  . Intimate partner violence:    Fear of current or ex partner: Not on file    Emotionally abused: Not on file    Physically abused: Not on file    Forced sexual activity: Not on file  Other Topics Concern  . Not on file  Social History Narrative   Has had training in massage therapy and nails   Is an LPN      Currently self employed with BF as handyman      Lives with Rosiland Oz 11   Christian 2    Family History  Problem Relation Age of Onset  . Miscarriages / [place] Mother   . Drug abuse Father   . Hypertension Father   . Kidney disease Father   . Cancer Father        kidney  . Stroke Maternal Grandmother   . Diabetes Maternal Grandmother   . Asthma Brother   . Cancer Maternal Aunt        breast    BP 116/76   Pulse 76   Ht 5\' 2"  (1.575 m)   Wt 173 lb (78.5 kg)   BMI 31.64 kg/m   Body mass index is 31.64 kg/m.      Objective:   Physical Exam  Constitutional: She is oriented to person, place, and time. She appears well-developed and well-nourished.  HENT:  Head: Normocephalic and atraumatic.  Eyes: Pupils are equal, round, and reactive to light. Conjunctivae and EOM are normal.  Neck: Normal range of motion.  Neck supple.  Cardiovascular: Normal rate, regular rhythm and intact distal pulses.  Pulmonary/Chest: Effort normal.  Abdominal: Soft.  Musculoskeletal:       Right knee: She exhibits swelling and  effusion. Tenderness found. Medial joint line tenderness noted.       Legs: Neurological: She is alert and oriented to person, place, and time. She has normal reflexes. She displays normal reflexes. No cranial nerve deficit. She exhibits normal muscle tone. Coordination normal.  Skin: Skin is warm and dry.  Psychiatric: She has a normal mood and affect. Her behavior is normal. Judgment and thought content normal.    I have reviewed the x-rays of the knee and her reports and her family doctor notes.  I have requested information on her back.  I did not specifically examine the back today but have ordered the x-rays.      Assessment & Plan:   Encounter Diagnoses  Name Primary?  . Chronic pain of right knee Yes  . Pain of right hip joint   . Scoliosis (and kyphoscoliosis), idiopathic    Get the MRI and the other x-rays as ordered.  Return after the studies are done.  Electronically Signed Darreld Mclean, MD 9/11/201910:08 AM

## 2017-12-28 NOTE — Patient Instructions (Addendum)
Smoking Tobacco Information Smoking tobacco will very likely harm your health. Tobacco contains a poisonous (toxic), colorless chemical called nicotine. Nicotine affects the brain and makes tobacco addictive. This change in your brain can make it hard to stop smoking. Tobacco also has other toxic chemicals that can hurt your body and raise your risk of many cancers. How can smoking tobacco affect me? Smoking tobacco can increase your chances of having serious health conditions, such as:  Cancer. Smoking is most commonly associated with lung cancer, but can lead to cancer in other parts of the body.  Chronic obstructive pulmonary disease (COPD). This is a long-term lung condition that makes it hard to breathe. It also gets worse over time.  High blood pressure (hypertension), heart disease, stroke, or heart attack.  Lung infections, such as pneumonia.  Cataracts. This is when the lenses in the eyes become clouded.  Digestive problems. This may include peptic ulcers, heartburn, and gastroesophageal reflux disease (GERD).  Oral health problems, such as gum disease and tooth loss.  Loss of taste and smell.  Smoking can affect your appearance by causing:  Wrinkles.  Yellow or stained teeth, fingers, and fingernails.  Smoking tobacco can also affect your social life.  Many workplaces, restaurants, hotels, and public places are tobacco-free. This means that you may experience challenges in finding places to smoke when away from home.  The cost of a smoking habit can be expensive. Expenses for someone who smokes come in two ways: ? You spend money on a regular basis to buy tobacco. ? Your health care costs in the long-term are higher if you smoke.  Tobacco smoke can also affect the health of those around you. Children of smokers have greater chances of: ? Sudden infant death syndrome (SIDS). ? Ear infections. ? Lung infections.  What lifestyle changes can be made?  Do not start  smoking. Quit if you already do.  To quit smoking: ? Make a plan to quit smoking and commit yourself to it. Look for programs to help you and ask your health care provider for recommendations and ideas. ? Talk with your health care provider about using nicotine replacement medicines to help you quit. Medicine replacement medicines include gum, lozenges, patches, sprays, or pills. ? Do not replace cigarette smoking with electronic cigarettes, which are commonly called e-cigarettes. The safety of e-cigarettes is not known, and some may contain harmful chemicals. ? Avoid places, people, or situations that tempt you to smoke. ? If you try to quit but return to smoking, don't give up hope. It is very common for people to try a number of times before they fully succeed. When you feel ready again, give it another try.  Quitting smoking might affect the way you eat as well as your weight. Be prepared to monitor your eating habits. Get support in planning and following a healthy diet.  Ask your health care provider about having regular tests (screenings) to check for cancer. This may include blood tests, imaging tests, and other tests.  Exercise regularly. Consider taking walks, joining a gym, or doing yoga or exercise classes.  Develop skills to manage your stress. These skills include meditation. What are the benefits of quitting smoking? By quitting smoking, you may:  Lower your risk of getting cancer and other diseases caused by smoking.  Live longer.  Breathe better.  Lower your blood pressure and heart rate.  Stop your addiction to tobacco.  Stop creating secondhand smoke that hurts other people.  Improve your   sense of taste and smell.  Look better over time, due to having fewer wrinkles and less staining.  What can happen if changes are not made? If you do not stop smoking, you may:  Get cancer and other diseases.  Develop COPD or other long-term (chronic) lung  conditions.  Develop serious problems with your heart and blood vessels (cardiovascular system).  Need more tests to screen for problems caused by smoking.  Have higher, long-term healthcare costs from medicines or treatments related to smoking.  Continue to have worsening changes in your lungs, mouth, and nose.  Where to find support: To get support to quit smoking, consider:  Asking your health care provider for more information and resources.  Taking classes to learn more about quitting smoking.  Looking for local organizations that offer resources about quitting smoking.  Joining a support group for people who want to quit smoking in your local community.  Where to find more information: You may find more information about quitting smoking from:  HelpGuide.org: www.helpguide.org/articles/addictions/how-to-quit-smoking.htm  BankRights.uy: smokefree.gov  American Lung Association: www.lung.org  Contact a health care provider if:  You have problems breathing.  Your lips, nose, or fingers turn blue.  You have chest pain.  You are coughing up blood.  You feel faint or you pass out.  You have other noticeable changes that cause you to worry. Summary  Smoking tobacco can negatively affect your health, the health of those around you, your finances, and your social life.  Do not start smoking. Quit if you already do. If you need help quitting, ask your health care provider.  Think about joining a support group for people who want to quit smoking in your local community. There are many effective programs that will help you to quit this behavior. This information is not intended to replace advice given to you by your health care provider. Make sure you discuss any questions you have with your health care provider. Document Released: 04/20/2016 Document Revised: 04/20/2016 Document Reviewed: 04/20/2016 Elsevier Interactive Patient Education  [employer].   ..Your  MRI has been ordered.  We will contact your insurance company for approval. After the authorization is received the MRI and a return appointment will be scheduled with you by phone.  If you do not hear from [place] within 5 business days please call [phone] and ask for the pre-authorization representative in our office.

## 2018-01-05 ENCOUNTER — Ambulatory Visit (HOSPITAL_COMMUNITY)
Admission: RE | Admit: 2018-01-05 | Discharge: 2018-01-05 | Disposition: A | Discharge status: Home / Self Care | Payer: Medicaid Other | Source: Ambulatory Visit | Attending: Orthopaedic Surgery | Admitting: Orthopaedic Surgery

## 2018-01-05 ENCOUNTER — Other Ambulatory Visit: Payer: Self-pay | Admitting: Orthopaedic Surgery

## 2018-01-05 ENCOUNTER — Telehealth: Payer: Self-pay | Admitting: Orthopaedic Surgery

## 2018-01-05 DIAGNOSIS — M25561 Pain in right knee: Principal | ICD-10-CM

## 2018-01-05 DIAGNOSIS — G8929 Other chronic pain: Secondary | ICD-10-CM | POA: Insufficient documentation

## 2018-01-05 DIAGNOSIS — M25551 Pain in right hip: Secondary | ICD-10-CM

## 2018-01-05 DIAGNOSIS — M4120 Other idiopathic scoliosis, site unspecified: Secondary | ICD-10-CM | POA: Insufficient documentation

## 2018-01-05 NOTE — Telephone Encounter (Signed)
Patient is to have scoliosis films and leg length films at hospital. She called here to day asking about this. Patient had the MRI and asked about getting the Xrays but they could not see the order at the hospital.   I have printed them out and will have Dr Hilda LiasKeeling sign them, if she needs the original order, but I am not sure why the orders could not be seen [facility]. I have called Radiology to discuss and [facility] and she has now found the orders, and patient may come in for the xrays.   I have advised Sarah at Dr Patty SermonsGosrani's office and also called patient to advise.

## 2018-01-05 NOTE — Telephone Encounter (Signed)
Jiles ProwsSarah Gray, NP, with Dr Karilyn CotaGosrani, patient's primary care office, has questions regarding Dr Sanjuan DameKeeling's recent notes and about the MRI. (transferred call to clinical staff)

## 2018-01-09 NOTE — Telephone Encounter (Signed)
I spoke to patient, she will go today and have the xrays.

## 2018-01-10 ENCOUNTER — Encounter: Payer: Self-pay | Admitting: Gastroenterology

## 2018-01-10 ENCOUNTER — Telehealth: Payer: Self-pay | Admitting: Radiology

## 2018-01-10 ENCOUNTER — Ambulatory Visit (HOSPITAL_COMMUNITY)
Admission: RE | Admit: 2018-01-10 | Discharge: 2018-01-10 | Disposition: A | Discharge status: Home / Self Care | Payer: Medicaid Other | Source: Ambulatory Visit | Attending: Orthopaedic Surgery | Admitting: Orthopaedic Surgery

## 2018-01-10 ENCOUNTER — Encounter: Payer: Self-pay | Admitting: Orthopaedic Surgery

## 2018-01-10 ENCOUNTER — Ambulatory Visit: Payer: Medicaid Other | Admitting: Orthopaedic Surgery

## 2018-01-10 VITALS — BP 121/83 | HR 89 | Ht 62.0 in | Wt 173.0 lb

## 2018-01-10 DIAGNOSIS — M419 Scoliosis, unspecified: Secondary | ICD-10-CM | POA: Diagnosis not present

## 2018-01-10 DIAGNOSIS — G8929 Other chronic pain: Secondary | ICD-10-CM

## 2018-01-10 DIAGNOSIS — M4120 Other idiopathic scoliosis, site unspecified: Secondary | ICD-10-CM

## 2018-01-10 DIAGNOSIS — M25551 Pain in right hip: Secondary | ICD-10-CM

## 2018-01-10 DIAGNOSIS — M25561 Pain in right knee: Secondary | ICD-10-CM | POA: Insufficient documentation

## 2018-01-10 NOTE — Patient Instructions (Signed)
Steps to Quit Smoking Smoking tobacco can be bad for your health. It can also affect almost every organ in your body. Smoking puts you and people around you at risk for many serious long-lasting (chronic) diseases. Quitting smoking is hard, but it is one of the best things that you can do for your health. It is never too late to quit. What are the benefits of quitting smoking? When you quit smoking, you lower your risk for getting serious diseases and conditions. They can include:  Lung cancer or lung disease.  Heart disease.  Stroke.  Heart attack.  Not being able to have children (infertility).  Weak bones (osteoporosis) and broken bones (fractures).  If you have coughing, wheezing, and shortness of breath, those symptoms may get better when you quit. You may also get sick less often. If you are pregnant, quitting smoking can help to lower your chances of having a baby of low birth weight. What can I do to help me quit smoking? Talk with your doctor about what can help you quit smoking. Some things you can do (strategies) include:  Quitting smoking totally, instead of slowly cutting back how much you smoke over a period of time.  Going to in-person counseling. You are more likely to quit if you go to many counseling sessions.  Using resources and support systems, such as: ? Online chats with a counselor. ? Phone quitlines. ? Printed self-help materials. ? Support groups or group counseling. ? Text messaging programs. ? Mobile phone apps or applications.  Taking medicines. Some of these medicines may have nicotine in them. If you are pregnant or breastfeeding, do not take any medicines to quit smoking unless your doctor says it is okay. Talk with your doctor about counseling or other things that can help you.  Talk with your doctor about using more than one strategy at the same time, such as taking medicines while you are also going to in-person counseling. This can help make  quitting easier. What things can I do to make it easier to quit? Quitting smoking might feel very hard at first, but there is a lot that you can do to make it easier. Take these steps:  Talk to your family and friends. Ask them to support and encourage you.  Call phone quitlines, reach out to support groups, or work with a counselor.  Ask people who smoke to not smoke around you.  Avoid places that make you want (trigger) to smoke, such as: ? Bars. ? Parties. ? Smoke-break areas at work.  Spend time with people who do not smoke.  Lower the stress in your life. Stress can make you want to smoke. Try these things to help your stress: ? Getting regular exercise. ? Deep-breathing exercises. ? Yoga. ? Meditating. ? Doing a body scan. To do this, close your eyes, focus on one area of your body at a time from head to toe, and notice which parts of your body are tense. Try to relax the muscles in those areas.  Download or buy apps on your mobile phone or tablet that can help you stick to your quit plan. There are many free apps, such as QuitGuide from the CDC (Centers for Disease Control and Prevention). You can find more support from smokefree.gov and other websites.  This information is not intended to replace advice given to you by your health care provider. Make sure you discuss any questions you have with your health care provider. Document Released: 01/30/2009 Document   Revised: 12/02/2015 Document Reviewed: 08/20/2014 Elsevier Interactive Patient Education  2018 Elsevier Inc.  

## 2018-01-10 NOTE — Telephone Encounter (Signed)
I called patient with xray report per Dr Hilda LiasKeeling  Her scoliosis curve is 44 degrees Her leg lengths are equal, only 3mm of discrepancy she does not need a lift in her shoe  Left message for her to call me back so I can advise.

## 2018-01-10 NOTE — Addendum Note (Signed)
Addended by: Baird KayUGLAS, Aalyssa Elderkin M on: 01/10/2018 10:05 AM   Modules accepted: Orders

## 2018-01-10 NOTE — Progress Notes (Signed)
Patient Lindsey Parker, female DOB:[birth year], 42 y.o. KGM:[id number]  Chief Complaint  Patient presents with  . Results    review MRI right knee  . Results    had scoliosis and leg length xrays today no report yet     HPI  Lindsey Parker is a 42 y.o. female who has right knee pain.  She had a MRI which showed: IMPRESSION: 1. Limited examination due to patient motion. 2. Intact ligamentous structures and no acute bony findings. 3. No obvious/gross meniscal tears. 4. The articular cartilage appears intact. No obvious chondral lesions or osteochondral defects. 5. No joint effusion or Baker's cyst.  I have explained the findings to her.  She does not need surgery.  She had x-rays done this morning at the X-ray department for leg lengths and scoliosis views but the reports are not available.  She may need need a shoe lift but I need to have the report first. I will have her seen at [facility] for the scoliosis.  She is agreeable to this.   Body mass index is 31.64 kg/m.  ROS  Review of Systems  Constitutional: Positive for activity change.  Musculoskeletal: Positive for arthralgias, back pain, gait problem and joint swelling.  All other systems reviewed and are negative.   All other systems reviewed and are negative.  The following is a summary of the past history medically, past history surgically, known current medicines, social history and family history.  This information is gathered electronically by the computer from prior information and documentation.  I review this each visit and have found including this information at this point in the chart is beneficial and informative.    Past Medical History:  Diagnosis Date  . Abnormal Pap smear of cervix    cone, cryo - age 34  . Anxiety   . GERD (gastroesophageal reflux disease)   . Ovarian cyst 03/09/2017  . Sciatica   . Scoliosis   . Substance abuse (HCC)    over 10 years    Past Surgical  History:  Procedure Laterality Date  . CESAREAN SECTION     x4  . TUBAL LIGATION      Family History  Problem Relation Age of Onset  . Miscarriages / [place] Mother   . Drug abuse Father   . Hypertension Father   . Kidney disease Father   . Cancer Father        kidney  . Stroke Maternal Grandmother   . Diabetes Maternal Grandmother   . Asthma Brother   . Cancer Maternal Aunt        breast    Social History Social History   Tobacco Use  . Smoking status: Current Every Day Smoker    Packs/day: 0.15    Types: Cigarettes    Start date: 04/19/1993  . Smokeless tobacco: Never Used  . Tobacco comment: 2-3 a day  Substance Use Topics  . Alcohol use: No  . Drug use: Yes    Types: Marijuana    Comment: 1-2 times a week    No Known Allergies  Current Outpatient Medications  Medication Sig Dispense Refill  . diclofenac sodium (VOLTAREN) 1 % GEL Apply 4 g topically 4 (four) times daily. 100 g 0  . ibuprofen (ADVIL,MOTRIN) 800 MG tablet Take 1 tablet (800 mg total) by mouth 3 (three) times daily. 30 tablet 0  . predniSONE (DELTASONE) 10 MG tablet Take 6 tablets day one, 5 tablets day two, 4 tablets day three,  3 tablets day four, 2 tablets day five, then 1 tablet day six 21 tablet 0  . cyclobenzaprine (FLEXERIL) 10 MG tablet Take 1 tablet (10 mg total) by mouth at bedtime as needed for muscle spasms. (Patient not taking: Reported on 12/28/2017) 30 tablet 0  . norethindrone (MICRONOR,CAMILA,ERRIN) 0.35 MG tablet Take 1 tablet (0.35 mg total) by mouth daily. (Patient not taking: Reported on 12/28/2017) 1 Package 11  . tiZANidine (ZANAFLEX) 4 MG tablet Take 1 tablet (4 mg total) by mouth at bedtime. May take one or two an hour before bed (Patient not taking: Reported on 03/01/2017) 60 tablet 3  . venlafaxine XR (EFFEXOR-XR) 37.5 MG 24 hr capsule TAKE 3 CAPSULES(112.5 MG) BY MOUTH DAILY WITH BREAKFAST (Patient not taking: Reported on 12/28/2017) 90 capsule 5   No current  facility-administered medications for this visit.      Physical Exam  Blood pressure 121/83, pulse 89, height 5\' 2"  (1.575 m), weight 173 lb (78.5 kg), last menstrual period 01/02/2018.  Constitutional: overall normal hygiene, normal nutrition, well developed, normal grooming, normal body habitus. Assistive device:none  Musculoskeletal: gait and station Limp right, muscle tone and strength are normal, no tremors or atrophy is present.  .  Neurological: coordination overall normal.  Deep tendon reflex/nerve stretch intact.  Sensation normal.  Cranial nerves II-XII intact.   Skin:   Normal overall no scars, lesions, ulcers or rashes. No psoriasis.  Psychiatric: Alert and oriented x 3.  Recent memory intact, remote memory unclear.  Normal mood and affect. Well groomed.  Good eye contact.  Cardiovascular: overall no swelling, no varicosities, no edema bilaterally, normal temperatures of the legs and arms, no clubbing, cyanosis and good capillary refill.  Right knee has tenderness and slight effusion and ROM of 0 to 110, more medial pain, limp to the right.  NV intact.  Lymphatic: palpation is normal.  All other systems reviewed and are negative   The patient has been educated about the nature of the problem(s) and counseled on treatment options.  The patient appeared to understand what I have discussed and is in agreement with it.  Encounter Diagnoses  Name Primary?  . Chronic pain of right knee Yes  . Scoliosis (and kyphoscoliosis), idiopathic     PLAN Call if any problems.  Precautions discussed.  Continue current medications.   Return to clinic 2 months   [facility].  Electronically Signed Darreld McleanWayne Gaynor Genco, MD 9/24/20199:58 AM

## 2018-01-12 NOTE — Telephone Encounter (Signed)
Called her to advise and gave her the xray reports. She voiced understanding  Leta will call her with the appointment information at [facility] when she has it.

## 2018-01-19 ENCOUNTER — Encounter: Payer: Self-pay | Admitting: Orthopaedic Surgery

## 2018-03-01 ENCOUNTER — Other Ambulatory Visit (HOSPITAL_COMMUNITY): Payer: Self-pay | Admitting: Nurse Practitioner

## 2018-03-01 DIAGNOSIS — M5416 Radiculopathy, lumbar region: Secondary | ICD-10-CM

## 2018-03-02 ENCOUNTER — Telehealth: Payer: Self-pay | Admitting: Orthopaedic Surgery

## 2018-03-02 NOTE — Telephone Encounter (Signed)
Patient called, had left voice message, inquiring about her referral to back specialist. Ph# [phone]

## 2018-03-03 NOTE — Telephone Encounter (Signed)
Called pt and left VM to call back with a little more information on what she needs.

## 2018-03-07 ENCOUNTER — Ambulatory Visit (HOSPITAL_COMMUNITY): Payer: Medicaid Other

## 2018-03-08 ENCOUNTER — Telehealth: Payer: Self-pay | Admitting: Orthopaedic Surgery

## 2018-03-08 NOTE — Telephone Encounter (Signed)
Patient left message on voicemail asking for someone to call her about her referral about her back.  Please call and advise [phone]

## 2018-03-09 ENCOUNTER — Ambulatory Visit (HOSPITAL_COMMUNITY)
Admission: RE | Admit: 2018-03-09 | Discharge: 2018-03-09 | Disposition: A | Discharge status: Home / Self Care | Payer: Medicaid Other | Source: Ambulatory Visit | Attending: Nurse Practitioner | Admitting: Nurse Practitioner

## 2018-03-09 DIAGNOSIS — M48061 Spinal stenosis, lumbar region without neurogenic claudication: Secondary | ICD-10-CM | POA: Insufficient documentation

## 2018-03-09 DIAGNOSIS — M5416 Radiculopathy, lumbar region: Secondary | ICD-10-CM | POA: Diagnosis present

## 2018-03-09 DIAGNOSIS — M4726 Other spondylosis with radiculopathy, lumbar region: Secondary | ICD-10-CM | POA: Diagnosis not present

## 2018-03-09 NOTE — Telephone Encounter (Signed)
Spoke with pt and let her know that she had an appointment scheduled with [facility] on 01/26/18. Pt states she did not know about the appointment. Gave pt contact information to [facility] and she will call to reschedule.

## 2018-03-14 ENCOUNTER — Ambulatory Visit: Payer: Medicaid Other | Admitting: Orthopaedic Surgery

## 2018-03-23 ENCOUNTER — Ambulatory Visit: Payer: Medicaid Other | Admitting: Orthopaedic Surgery

## 2018-04-18 ENCOUNTER — Ambulatory Visit: Payer: Medicaid Other | Admitting: Nurse Practitioner

## 2018-06-01 DIAGNOSIS — M5416 Radiculopathy, lumbar region: Secondary | ICD-10-CM | POA: Diagnosis not present

## 2018-06-27 DIAGNOSIS — G479 Sleep disorder, unspecified: Secondary | ICD-10-CM | POA: Diagnosis not present

## 2018-06-27 DIAGNOSIS — D649 Anemia, unspecified: Secondary | ICD-10-CM | POA: Diagnosis not present

## 2018-06-27 DIAGNOSIS — F331 Major depressive disorder, recurrent, moderate: Secondary | ICD-10-CM | POA: Diagnosis not present

## 2018-06-27 DIAGNOSIS — E785 Hyperlipidemia, unspecified: Secondary | ICD-10-CM | POA: Diagnosis not present

## 2018-07-12 ENCOUNTER — Encounter: Payer: Self-pay | Admitting: Gastroenterology

## 2018-08-21 DIAGNOSIS — K080 Exfoliation of teeth due to systemic causes: Secondary | ICD-10-CM | POA: Diagnosis not present

## 2018-09-05 DIAGNOSIS — M4180 Other forms of scoliosis, site unspecified: Secondary | ICD-10-CM | POA: Diagnosis not present

## 2018-09-05 DIAGNOSIS — M5416 Radiculopathy, lumbar region: Secondary | ICD-10-CM | POA: Diagnosis not present

## 2018-09-21 ENCOUNTER — Telehealth: Payer: Self-pay | Admitting: Gastroenterology

## 2018-09-21 ENCOUNTER — Ambulatory Visit: Payer: Medicaid Other | Admitting: Gastroenterology

## 2018-09-21 ENCOUNTER — Encounter: Payer: Self-pay | Admitting: Gastroenterology

## 2018-09-21 NOTE — Telephone Encounter (Signed)
Patient was a no show and letter sent  °

## 2018-10-25 ENCOUNTER — Ambulatory Visit (INDEPENDENT_AMBULATORY_CARE_PROVIDER_SITE_OTHER): Payer: Medicaid Other | Admitting: Internal Medicine

## 2018-10-25 DIAGNOSIS — E785 Hyperlipidemia, unspecified: Secondary | ICD-10-CM | POA: Diagnosis not present

## 2018-10-25 DIAGNOSIS — R197 Diarrhea, unspecified: Secondary | ICD-10-CM | POA: Diagnosis not present

## 2018-10-25 DIAGNOSIS — K0889 Other specified disorders of teeth and supporting structures: Secondary | ICD-10-CM | POA: Diagnosis not present

## 2018-10-25 DIAGNOSIS — F331 Major depressive disorder, recurrent, moderate: Secondary | ICD-10-CM | POA: Diagnosis not present

## 2019-01-08 ENCOUNTER — Other Ambulatory Visit (INDEPENDENT_AMBULATORY_CARE_PROVIDER_SITE_OTHER): Payer: Self-pay | Admitting: Nurse Practitioner

## 2019-01-08 ENCOUNTER — Other Ambulatory Visit (INDEPENDENT_AMBULATORY_CARE_PROVIDER_SITE_OTHER): Payer: Self-pay | Admitting: Internal Medicine

## 2019-01-08 MED ORDER — IBUPROFEN 800 MG PO TABS: 800.0000 mg | ORAL_TABLET | Freq: Three times a day (TID) | ORAL | 0 refills | Status: DC

## 2019-02-12 ENCOUNTER — Ambulatory Visit (INDEPENDENT_AMBULATORY_CARE_PROVIDER_SITE_OTHER): Payer: Medicaid Other | Admitting: Internal Medicine

## 2019-02-19 ENCOUNTER — Other Ambulatory Visit (INDEPENDENT_AMBULATORY_CARE_PROVIDER_SITE_OTHER): Payer: Self-pay | Admitting: Internal Medicine

## 2019-02-19 MED ORDER — IBUPROFEN 800 MG PO TABS: 800.0000 mg | ORAL_TABLET | Freq: Three times a day (TID) | ORAL | 0 refills | Status: DC | PRN

## 2019-02-27 ENCOUNTER — Ambulatory Visit (INDEPENDENT_AMBULATORY_CARE_PROVIDER_SITE_OTHER): Payer: Medicaid Other | Admitting: Internal Medicine

## 2019-03-13 ENCOUNTER — Other Ambulatory Visit (INDEPENDENT_AMBULATORY_CARE_PROVIDER_SITE_OTHER): Payer: Self-pay | Admitting: Nurse Practitioner

## 2019-04-11 ENCOUNTER — Other Ambulatory Visit (INDEPENDENT_AMBULATORY_CARE_PROVIDER_SITE_OTHER): Payer: Self-pay

## 2019-04-11 MED ORDER — IBUPROFEN 800 MG PO TABS: ORAL_TABLET | ORAL | 0 refills | Status: DC

## 2019-05-10 ENCOUNTER — Other Ambulatory Visit (INDEPENDENT_AMBULATORY_CARE_PROVIDER_SITE_OTHER): Payer: Self-pay | Admitting: Internal Medicine

## 2019-05-14 ENCOUNTER — Other Ambulatory Visit (INDEPENDENT_AMBULATORY_CARE_PROVIDER_SITE_OTHER): Payer: Self-pay

## 2019-05-14 ENCOUNTER — Telehealth (INDEPENDENT_AMBULATORY_CARE_PROVIDER_SITE_OTHER): Payer: Self-pay

## 2019-05-14 NOTE — Telephone Encounter (Signed)
Pt LVM that she needed her Ibuprofen called in .  I called the pt back, no answer and voicemail full

## 2019-05-14 NOTE — Telephone Encounter (Signed)
Pt has made an appt for 1-27, but is asking for you to send in her ibuprofen

## 2019-05-16 ENCOUNTER — Ambulatory Visit (INDEPENDENT_AMBULATORY_CARE_PROVIDER_SITE_OTHER): Payer: Medicaid Other | Admitting: Internal Medicine

## 2019-05-16 ENCOUNTER — Other Ambulatory Visit (INDEPENDENT_AMBULATORY_CARE_PROVIDER_SITE_OTHER): Payer: Self-pay | Admitting: Internal Medicine

## 2019-05-21 MED ORDER — IBUPROFEN 800 MG PO TABS: ORAL_TABLET | ORAL | 0 refills | Status: DC

## 2019-07-19 DIAGNOSIS — M5136 Other intervertebral disc degeneration, lumbar region: Secondary | ICD-10-CM | POA: Diagnosis not present

## 2019-07-19 DIAGNOSIS — G5603 Carpal tunnel syndrome, bilateral upper limbs: Secondary | ICD-10-CM | POA: Diagnosis not present

## 2019-07-19 DIAGNOSIS — M5416 Radiculopathy, lumbar region: Secondary | ICD-10-CM | POA: Diagnosis not present

## 2019-07-19 DIAGNOSIS — M4185 Other forms of scoliosis, thoracolumbar region: Secondary | ICD-10-CM | POA: Diagnosis not present

## 2019-07-19 DIAGNOSIS — M4180 Other forms of scoliosis, site unspecified: Secondary | ICD-10-CM | POA: Diagnosis not present

## 2019-08-05 DIAGNOSIS — M5116 Intervertebral disc disorders with radiculopathy, lumbar region: Secondary | ICD-10-CM | POA: Diagnosis not present

## 2019-08-05 DIAGNOSIS — M4807 Spinal stenosis, lumbosacral region: Secondary | ICD-10-CM | POA: Diagnosis not present

## 2019-08-05 DIAGNOSIS — M5117 Intervertebral disc disorders with radiculopathy, lumbosacral region: Secondary | ICD-10-CM | POA: Diagnosis not present

## 2019-08-05 DIAGNOSIS — M48061 Spinal stenosis, lumbar region without neurogenic claudication: Secondary | ICD-10-CM | POA: Diagnosis not present

## 2019-08-17 ENCOUNTER — Telehealth: Payer: Self-pay | Admitting: Obstetrics & Gynecology

## 2019-08-17 NOTE — Telephone Encounter (Signed)
Tried to reach the patient to remind her of her appointment/restrictions, mailbox is full. 

## 2019-08-20 ENCOUNTER — Other Ambulatory Visit: Payer: Self-pay

## 2019-08-20 ENCOUNTER — Encounter: Payer: Self-pay | Admitting: Obstetrics & Gynecology

## 2019-08-20 ENCOUNTER — Ambulatory Visit (INDEPENDENT_AMBULATORY_CARE_PROVIDER_SITE_OTHER): Payer: Medicaid Other | Admitting: Obstetrics & Gynecology

## 2019-08-20 VITALS — BP 105/71 | HR 90 | Ht 66.0 in | Wt 135.0 lb

## 2019-08-20 DIAGNOSIS — R1031 Right lower quadrant pain: Secondary | ICD-10-CM

## 2019-08-20 DIAGNOSIS — G8929 Other chronic pain: Secondary | ICD-10-CM | POA: Diagnosis not present

## 2019-08-20 DIAGNOSIS — G5791 Unspecified mononeuropathy of right lower limb: Secondary | ICD-10-CM | POA: Diagnosis not present

## 2019-08-20 NOTE — Progress Notes (Signed)
Chief Complaint  Patient presents with  . rt ovary pain      44 y.o. [id number] Patient's last menstrual period was 08/06/2019. The current method of family planning is tubal ligation.  Outpatient Encounter Medications as of 08/20/2019  Medication Sig  . ibuprofen (ADVIL) 800 MG tablet TAKE 1 TABLET BY MOUTH THREE TIMES DAILY AS NEEDED WITH FOOD  . cyclobenzaprine (FLEXERIL) 10 MG tablet Take 1 tablet (10 mg total) by mouth at bedtime as needed for muscle spasms. (Patient not taking: Reported on 12/28/2017)  . diclofenac sodium (VOLTAREN) 1 % GEL Apply 4 g topically 4 (four) times daily. (Patient not taking: Reported on 08/20/2019)  . norethindrone (MICRONOR,CAMILA,ERRIN) 0.35 MG tablet Take 1 tablet (0.35 mg total) by mouth daily. (Patient not taking: Reported on 12/28/2017)  . predniSONE (DELTASONE) 10 MG tablet Take 6 tablets day one, 5 tablets day two, 4 tablets day three, 3 tablets day four, 2 tablets day five, then 1 tablet day six (Patient not taking: Reported on 08/20/2019)  . tiZANidine (ZANAFLEX) 4 MG tablet Take 1 tablet (4 mg total) by mouth at bedtime. May take one or two an hour before bed (Patient not taking: Reported on 03/01/2017)  . venlafaxine XR (EFFEXOR-XR) 37.5 MG 24 hr capsule TAKE 3 CAPSULES(112.5 MG) BY MOUTH DAILY WITH BREAKFAST (Patient not taking: Reported on 12/28/2017)  . [DISCONTINUED] ibuprofen (ADVIL) 800 MG tablet Take 1 tablet (800 mg total) by mouth every 8 (eight) hours as needed.   No facility-administered encounter medications on file as of 08/20/2019.    Subjective Pt with chronic pain RLQ, was seen for similar by another provider 3+ years ago here at our office MRI does revel some lumbar pathology that could explain the nerve pain and she has had 4 c sections which could also be contributing Past Medical History:  Diagnosis Date  . Abnormal Pap smear of cervix    cone, cryo - age 42  . Anxiety   . GERD (gastroesophageal reflux disease)   . Ovarian  cyst 03/09/2017  . Sciatica   . Scoliosis   . Substance abuse (HCC)    over 10 years    Past Surgical History:  Procedure Laterality Date  . CESAREAN SECTION     x4  . TUBAL LIGATION      OB History    Gravida  5   Para  4   Term      Preterm      AB  1   Living  4     SAB  1   TAB      Ectopic      Multiple      Live Births              No Known Allergies  Social History   Socioeconomic History  . Marital status: Single    Spouse name: BF-Christian  . Number of children: 4  . Years of education: 76  . Highest education level: Not on file  Occupational History  . 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 Occupation: LPN by training  Tobacco Use  . Smoking status: Current Every Day Smoker    Packs/day: 0.15    Types: Cigarettes    Start date: 04/19/1993  . Smokeless tobacco: Never Used  . Tobacco comment: 2-3 a day  Substance and Sexual Activity  . Alcohol use: No  . Drug use: Yes    Types: Marijuana  Comment: 1-2 times a week  . Sexual activity: Yes    Birth control/protection: Surgical    Comment: tubal  Other Topics Concern  . Not on file  Social History Narrative   Has had training in massage therapy and nails   Is an LPN      Currently self employed with BF as handyman      Lives with Rosiland Oz 11   Christian 2   Social Determinants of Health   Financial Resource Strain:   . Difficulty of Paying Living Expenses:   Food Insecurity:   . Worried About Programme researcher, broadcasting/film/video in the Last Year:   . Barista in the Last Year:   Transportation Needs:   . Freight forwarder (Medical):   Marland Kitchen Lack of Transportation (Non-Medical):   Physical Activity:   . Days of Exercise per Week:   . Minutes of Exercise per Session:   Stress:   . Feeling of Stress :   Social Connections:   . Frequency of Communication with Friends and Family:   . Frequency of Social Gatherings with Friends and Family:     . Attends Religious Services:   . Active Member of Clubs or Organizations:   . Attends Banker Meetings:   Marland Kitchen Marital Status:     Family History  Problem Relation Age of Onset  . Miscarriages / [place] Mother   . Drug abuse Father   . Hypertension Father   . Kidney disease Father   . Cancer Father        kidney  . Stroke Maternal Grandmother   . Diabetes Maternal Grandmother   . Asthma Brother   . Cancer Maternal Aunt        breast    Medications:       Current Outpatient Medications:  .  ibuprofen (ADVIL) 800 MG tablet, TAKE 1 TABLET BY MOUTH THREE TIMES DAILY AS NEEDED WITH FOOD, Disp: 90 tablet, Rfl: 0 .  cyclobenzaprine (FLEXERIL) 10 MG tablet, Take 1 tablet (10 mg total) by mouth at bedtime as needed for muscle spasms. (Patient not taking: Reported on 12/28/2017), Disp: 30 tablet, Rfl: 0 .  diclofenac sodium (VOLTAREN) 1 % GEL, Apply 4 g topically 4 (four) times daily. (Patient not taking: Reported on 08/20/2019), Disp: 100 g, Rfl: 0 .  norethindrone (MICRONOR,CAMILA,ERRIN) 0.35 MG tablet, Take 1 tablet (0.35 mg total) by mouth daily. (Patient not taking: Reported on 12/28/2017), Disp: 1 Package, Rfl: 11 .  predniSONE (DELTASONE) 10 MG tablet, Take 6 tablets day one, 5 tablets day two, 4 tablets day three, 3 tablets day four, 2 tablets day five, then 1 tablet day six (Patient not taking: Reported on 08/20/2019), Disp: 21 tablet, Rfl: 0 .  tiZANidine (ZANAFLEX) 4 MG tablet, Take 1 tablet (4 mg total) by mouth at bedtime. May take one or two an hour before bed (Patient not taking: Reported on 03/01/2017), Disp: 60 tablet, Rfl: 3 .  venlafaxine XR (EFFEXOR-XR) 37.5 MG 24 hr capsule, TAKE 3 CAPSULES(112.5 MG) BY MOUTH DAILY WITH BREAKFAST (Patient not taking: Reported on 12/28/2017), Disp: 90 capsule, Rfl: 5  Objective Blood pressure 105/71, pulse 90, height 5\' 6"  (1.676 m), weight 135 lb (61.2 kg), last menstrual period 08/06/2019.  +Cornet's sign, iliohypogastric  and/or ilioinguinal nerve pain  Trigger Point Injection   Pre-operative diagnosis: myofascial/neurogenic pain  Post-operative diagnosis: same  After risks and benefits were explained including bleeding, infection, worsening of the pain,  damage to the area being injected, weakness, allergic reaction to medications, vascular injection, and nerve damage, signed consent was obtained.  All questions were answered.    The area of the trigger point was identified and the skin prepped three times with alcohol and the alcohol allowed to dry.  Next, a 25 gauge 0.5 inch needle was placed in the area of the trigger point.  Once reproduction of the pain was elicited and negative aspiration confirmed, the trigger point was injected and the needle removed.    The patient did tolerate the procedure well and there were not complications.    Medication used:  20 cc total 0.5% marcain   Trigger points injected: 2    Trigger point(s) location(s):  right ilioingunial/iliohypogastric distribution at the juntion of the rectus abdominus and transversalis  Pertinent ROS No burning with urination, frequency or urgency No nausea, vomiting or diarrhea Nor fever chills or other constitutional symptoms   Labs or studies     Impression Diagnoses this Encounter::   ICD-10-CM   1. 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 Chronic RLQ pain  R10.31 US PELVIS TRANSVAGINAL NON-OB (TV ONLY)   G89.29 US PELVIS (TRANSABDOMINAL ONLY)   pain source much too medial to be ovarian    Established relevant diagnosis(es):   Plan/Recommendations: No orders of the defined types were placed in this encounter.   Labs or Scans Ordered: Orders Placed This Encounter  Procedures  . US PELVIS TRANSVAGINAL NON-OB (TV ONLY)  . US PELVIS (TRANSABDOMINAL ONLY)    Management:: Re evaluate her response in 3 weeks Also  pelvic sonogram to evaluate her right ovary  Follow up Return in about 3 weeks (around 09/10/2019) for GYN sono, Follow up, with Dr Elonda Husky.        All questions were answered.

## 2019-09-06 DIAGNOSIS — G5603 Carpal tunnel syndrome, bilateral upper limbs: Secondary | ICD-10-CM | POA: Diagnosis not present

## 2019-09-07 ENCOUNTER — Telehealth: Payer: Self-pay | Admitting: Obstetrics & Gynecology

## 2019-09-07 NOTE — Telephone Encounter (Signed)
Unable to contact patient due to voicemail box was full to review covid restrictions and to wear a mask during appt will screen patient upon check in for covid symptoms.

## 2019-09-10 ENCOUNTER — Ambulatory Visit: Payer: Medicaid Other | Admitting: Obstetrics & Gynecology

## 2019-09-10 ENCOUNTER — Other Ambulatory Visit: Payer: Medicaid Other

## 2019-09-12 ENCOUNTER — Telehealth: Payer: Self-pay | Admitting: Obstetrics & Gynecology

## 2019-09-12 NOTE — Telephone Encounter (Signed)
Unable to leave patient voicemail to go over covid restrictions. Will screen patient upon check in at upcoming appointment.

## 2019-09-13 ENCOUNTER — Other Ambulatory Visit: Payer: Medicaid Other

## 2019-09-19 ENCOUNTER — Telehealth: Payer: Self-pay | Admitting: Obstetrics & Gynecology

## 2019-09-19 NOTE — Telephone Encounter (Signed)
Unable to leave message for patient will screen patient upon check in at upcoming appointment on 09/20/19

## 2019-09-20 ENCOUNTER — Encounter: Payer: Medicaid Other | Admitting: Obstetrics & Gynecology

## 2019-09-21 ENCOUNTER — Telehealth: Payer: Self-pay | Admitting: Adult Health

## 2019-09-21 NOTE — Telephone Encounter (Signed)

## 2019-09-24 ENCOUNTER — Other Ambulatory Visit: Payer: Medicaid Other | Admitting: Adult Health

## 2019-10-01 NOTE — Progress Notes (Signed)
This encounter was created in error - please disregard.

## 2019-10-05 ENCOUNTER — Telehealth: Payer: Self-pay | Admitting: Obstetrics & Gynecology

## 2019-10-05 NOTE — Telephone Encounter (Signed)

## 2019-10-08 ENCOUNTER — Other Ambulatory Visit: Payer: Medicaid Other

## 2019-10-08 ENCOUNTER — Ambulatory Visit: Payer: Medicaid Other | Admitting: Obstetrics & Gynecology

## 2019-11-01 ENCOUNTER — Encounter: Payer: Self-pay | Admitting: Obstetrics & Gynecology

## 2019-11-01 ENCOUNTER — Ambulatory Visit (INDEPENDENT_AMBULATORY_CARE_PROVIDER_SITE_OTHER): Payer: Medicaid Other | Admitting: Obstetrics & Gynecology

## 2019-11-01 ENCOUNTER — Ambulatory Visit (INDEPENDENT_AMBULATORY_CARE_PROVIDER_SITE_OTHER): Payer: Medicaid Other

## 2019-11-01 VITALS — BP 116/61 | HR 78 | Ht 63.0 in | Wt 133.0 lb

## 2019-11-01 DIAGNOSIS — N83201 Unspecified ovarian cyst, right side: Secondary | ICD-10-CM

## 2019-11-01 DIAGNOSIS — G5791 Unspecified mononeuropathy of right lower limb: Secondary | ICD-10-CM

## 2019-11-01 DIAGNOSIS — G8929 Other chronic pain: Secondary | ICD-10-CM | POA: Diagnosis not present

## 2019-11-01 DIAGNOSIS — R1031 Right lower quadrant pain: Secondary | ICD-10-CM | POA: Diagnosis not present

## 2019-11-01 NOTE — Progress Notes (Signed)
Follow up appointment for results  No chief complaint on file.   Blood pressure 116/61, pulse 78, height 5\' 3"  (1.6 m), weight 133 lb (60.3 kg), last menstrual period 10/27/2019.  12/28/2019 PELVIS TRANSVAGINAL NON-OB (TV ONLY)  Result Date: 11/01/2019 GYNECOLOGIC SONOGRAM Lindsey Parker is a 44 y.o. 45 LMP 10/26/2019 She is here for a pelvic sonogram for RLQ pain. Uterus                      8.6 x 4.5 x 5.7 cm, Total uterine volume 115 cc,homogeneous anteverted uterus,wnl Endometrium          6.8 mm, symmetrical, wnl Right ovary             2.7 x 1.8 x 2.7 cm, wnl Left ovary                3.8 x 2.1 x 2.7 cm, simple left ovarian cyst 2.7 x 2.1 x 2.4 cm No free fluid Technician Comments: PELVIC 12/27/2019 TA/TV:homogeneous anteverted uterus,wnl,EEC 6.8 mm,simple left ovarian cyst 2.7 x 2.1 x 2.4 cm,normal right ovary,ovaries appear mobile,no free fluid,pelvic discomfort during ultrasound [place] 11/01/2019 3:11 PM Clinical Impression and recommendations: I have reviewed the sonogram results above, combined with the patient's current clinical course, below are my impressions and any appropriate recommendations for management based on the sonographic findings. Uterus is normal as is the endometrium Right ovary normal no cyst present Left ovary with physiologic cyst 11/03/2019 11/01/2019 3:43 PM  11/03/2019 PELVIS (TRANSABDOMINAL ONLY)  Result Date: 11/01/2019 GYNECOLOGIC SONOGRAM Lindsey Parker is a 44 y.o. 45 LMP 10/26/2019 She is here for a pelvic sonogram for RLQ pain. Uterus                      8.6 x 4.5 x 5.7 cm, Total uterine volume 115 cc,homogeneous anteverted uterus,wnl Endometrium          6.8 mm, symmetrical, wnl Right ovary             2.7 x 1.8 x 2.7 cm, wnl Left ovary                3.8 x 2.1 x 2.7 cm, simple left ovarian cyst 2.7 x 2.1 x 2.4 cm No free fluid Technician Comments: PELVIC 12/27/2019 TA/TV:homogeneous anteverted uterus,wnl,EEC 6.8 mm,simple left ovarian cyst 2.7 x 2.1 x 2.4 cm,normal right  ovary,ovaries appear mobile,no free fluid,pelvic discomfort during ultrasound [place] 11/01/2019 3:11 PM Clinical Impression and recommendations: I have reviewed the sonogram results above, combined with the patient's current clinical course, below are my impressions and any appropriate recommendations for management based on the sonographic findings. Uterus is normal as is the endometrium Right ovary normal no cyst present Left ovary with physiologic cyst 11/03/2019 11/01/2019 3:43 PM     MEDS ordered this encounter: No orders of the defined types were placed in this encounter.   Orders for this encounter: No orders of the defined types were placed in this encounter.   Impression: RLQ pain, iliohypogastric neuralgia Resolved cyst  Plan: Discussed results Recommended to diminish the intensity of her core work Recommended Dr 11/03/2019 for interest in abdominoplasty  Follow Up: No follow-ups on file.       Face to face time:  10 minutes  Greater than 50% of the visit time was spent in counseling and coordination of care with the patient.  The summary and outline of the counseling  and care coordination is summarized in the note above.   All questions were answered.  Past Medical History:  Diagnosis Date  . Abnormal Pap smear of cervix    cone, cryo - age 74  . Anxiety   . GERD (gastroesophageal reflux disease)   . Ovarian cyst 03/09/2017  . Sciatica   . Scoliosis   . Substance abuse (HCC)    over 10 years    Past Surgical History:  Procedure Laterality Date  . CESAREAN SECTION     x4  . TUBAL LIGATION      OB History    Gravida  5   Para  4   Term      Preterm      AB  1   Living  4     SAB  1   TAB      Ectopic      Multiple      Live Births              No Known Allergies  Social History   Socioeconomic History  . Marital status: Single    Spouse name: BF-Christian  . Number of children: 4  . Years of education: 53   . Highest education level: Not on file  Occupational History  . 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 Occupation: LPN by training  Tobacco Use  . Smoking status: Current Every Day Smoker    Packs/day: 0.15    Types: Cigarettes    Start date: 04/19/1993  . Smokeless tobacco: Never Used  . Tobacco comment: 2-3 a day  Vaping Use  . Vaping Use: Never used  Substance and Sexual Activity  . Alcohol use: No  . Drug use: Yes    Types: Marijuana    Comment: 1-2 times a week  . Sexual activity: Yes    Birth control/protection: Surgical    Comment: tubal  Other Topics Concern  . Not on file  Social History Narrative   Has had training in massage therapy and nails   Is an LPN      Currently self employed with BF as handyman      Lives with Rosiland Oz 11   Christian 2   Social Determinants of Health   Financial Resource Strain:   . Difficulty of Paying Living Expenses:   Food Insecurity:   . Worried About Programme researcher, broadcasting/film/video in the Last Year:   . Barista in the Last Year:   Transportation Needs:   . Freight forwarder (Medical):   Marland Kitchen Lack of Transportation (Non-Medical):   Physical Activity:   . Days of Exercise per Week:   . Minutes of Exercise per Session:   Stress:   . Feeling of Stress :   Social Connections:   . Frequency of Communication with Friends and Family:   . Frequency of Social Gatherings with Friends and Family:   . Attends Religious Services:   . Active Member of Clubs or Organizations:   . Attends Banker Meetings:   Marland Kitchen Marital Status:     Family History  Problem Relation Age of Onset  . Miscarriages / [place] Mother   . Drug abuse Father   . Hypertension Father   . Kidney disease Father   . Cancer Father        kidney  . Stroke Maternal Grandmother   . Diabetes Maternal Grandmother   .  Asthma Brother   . Cancer Maternal Aunt        breast

## 2019-11-01 NOTE — Progress Notes (Signed)
PELVIC US TA/TV:homogeneous anteverted uterus,wnl,EEC 6.8 mm,simple left ovarian cyst 2.7 x 2.1 x 2.4 cm,normal right ovary,ovaries appear mobile,no free fluid,pelvic discomfort during ultrasound

## 2019-11-27 IMAGING — MR MR KNEE*R* W/O CM
4 of 7 series · 17 of 40 positions shown · non-contrast
Comparison: Radiograph 11/13/2017

CLINICAL DATA: Severe right knee pain.

EXAM:
MRI OF THE RIGHT KNEE WITHOUT CONTRAST
TECHNIQUE: Multiplanar, multisequence MR imaging of the knee was performed. No
intravenous contrast was administered.

[Series 5: t2fs axial · axial · 4.0mm · 0.23mm/px · z∈[-77,+38]mm · 6 of 24 slices shown]
[im 1/24]
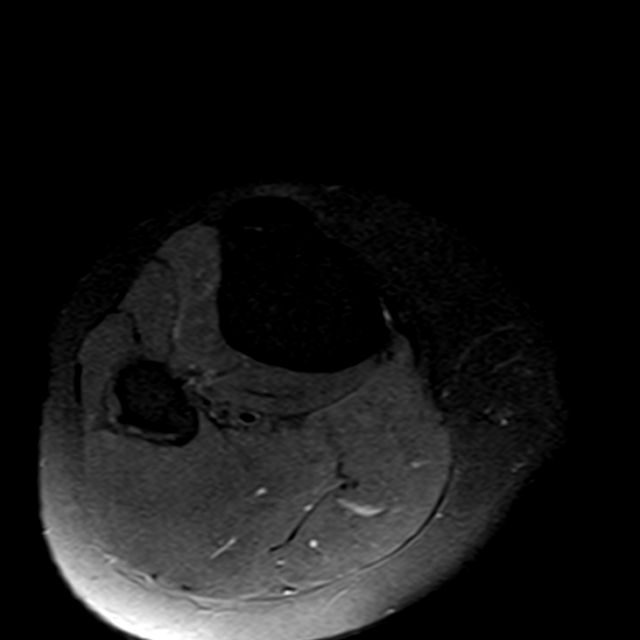
[im 5/24]
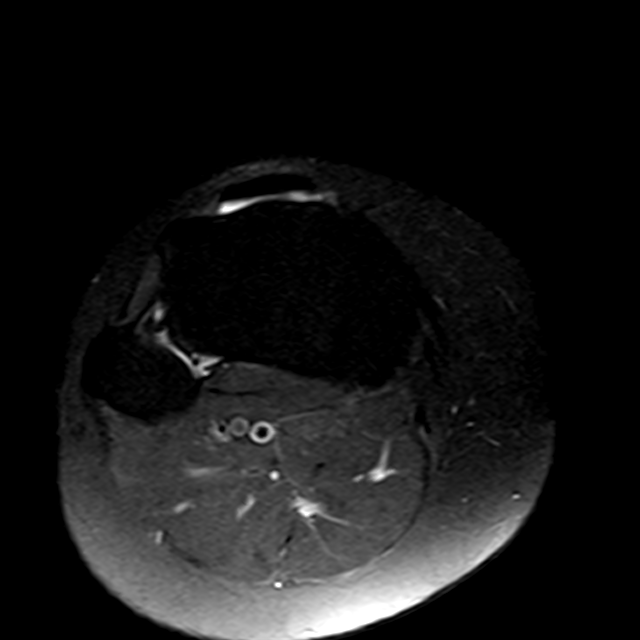
[im 10/24]
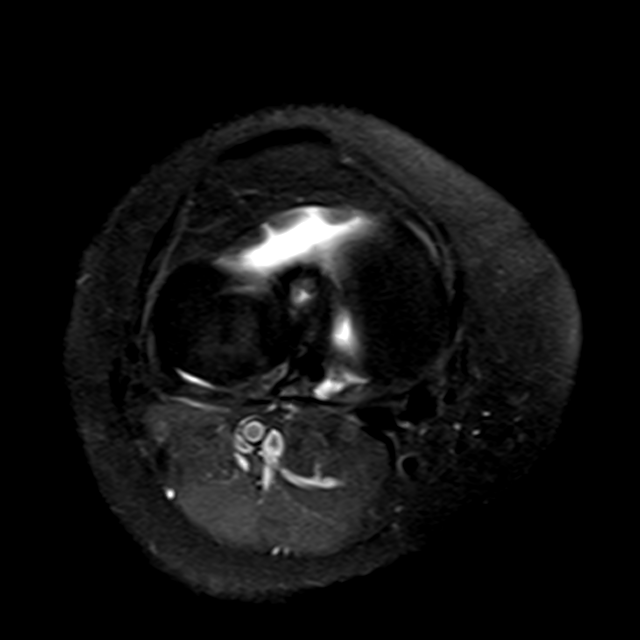
[im 14/24]
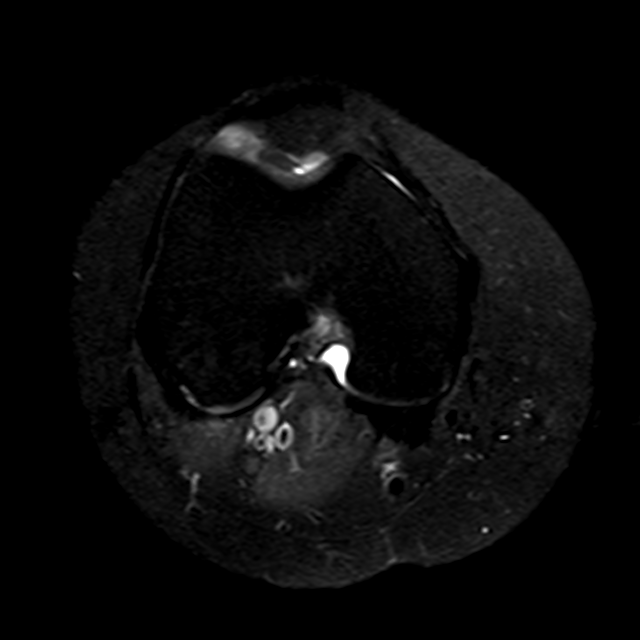
[im 19/24]
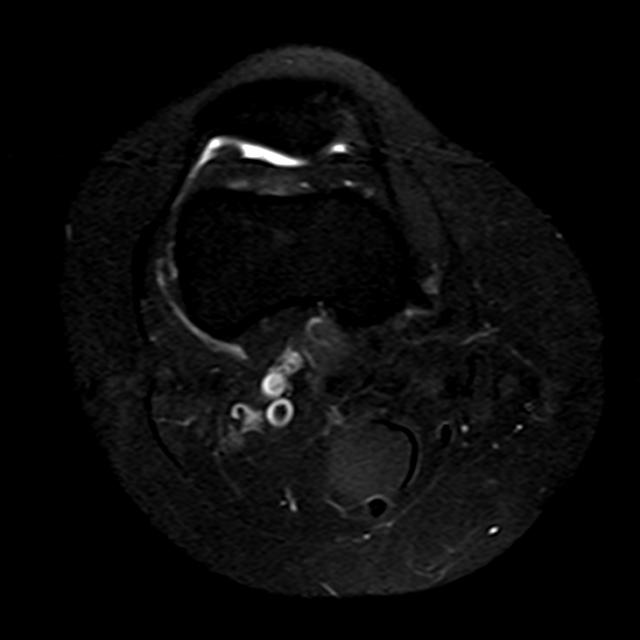
[im 24/24]
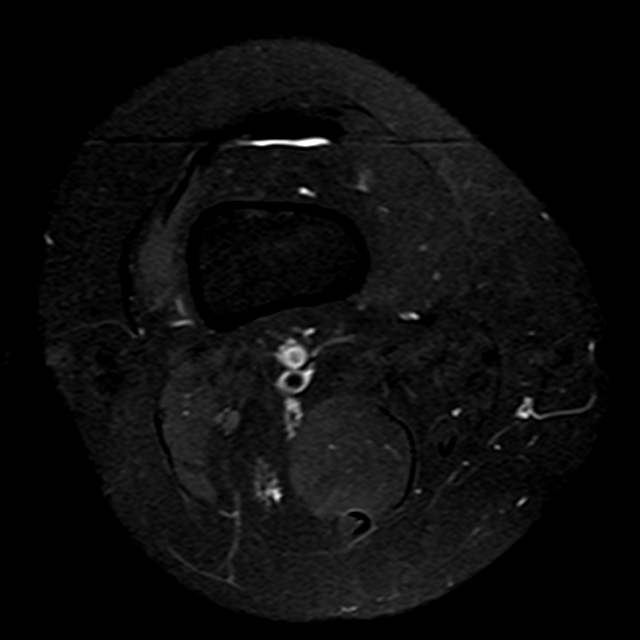

[Series 6: T1 · coronal · 4.0mm · 0.26mm/px · 5 of 26 slices shown]
[im 1/26]
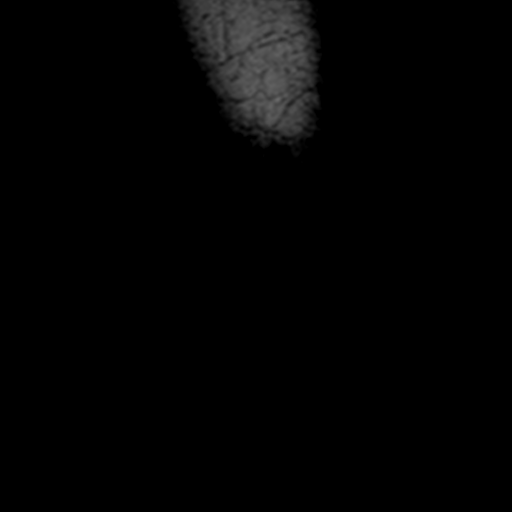
[im 7/26]
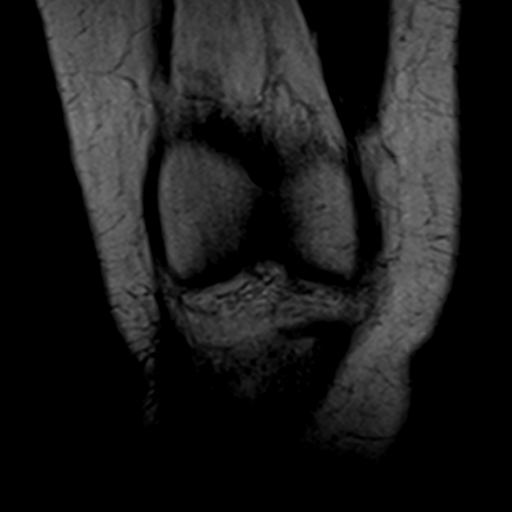
[im 13/26]
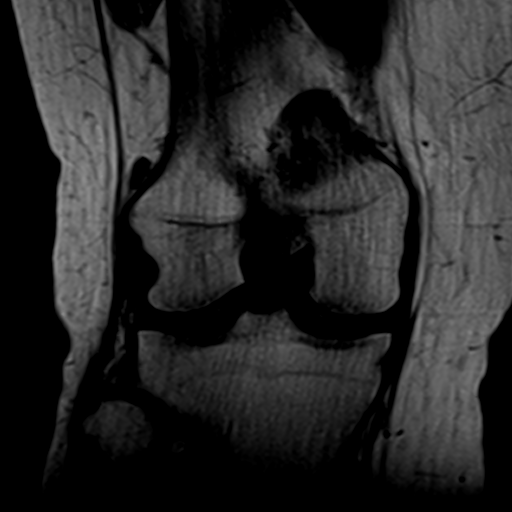
[im 19/26]
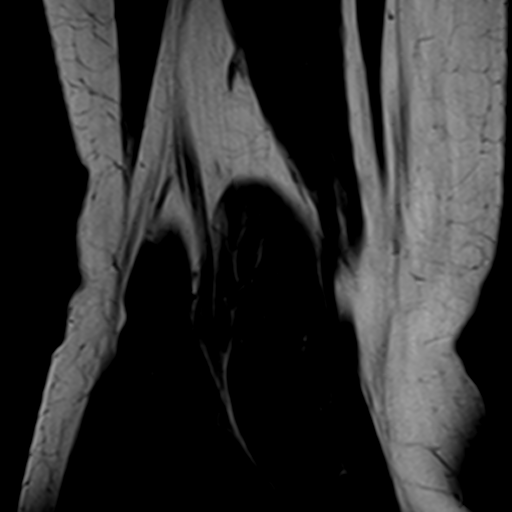
[im 26/26]
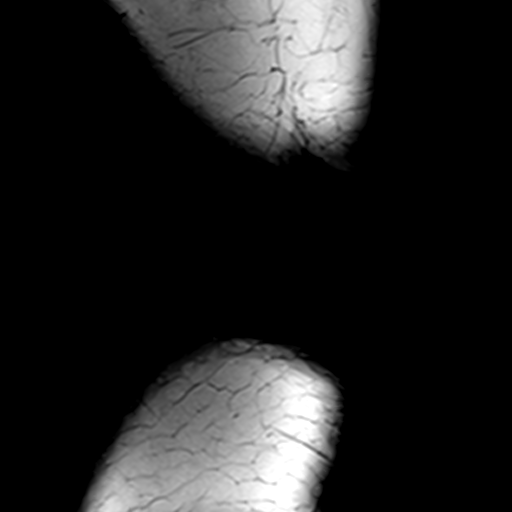

[Series 7: pdfs sag · sagittal · 3.0mm · 0.22mm/px · 3 of 29 slices shown]
[im 6/29]
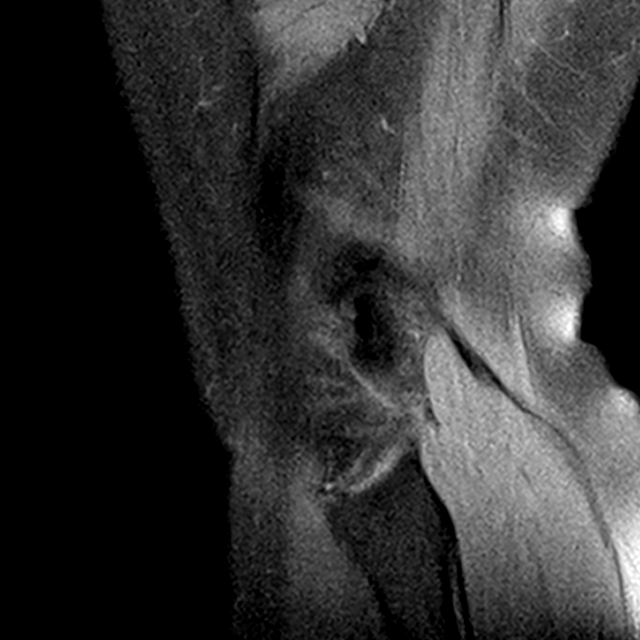
[im 17/29]
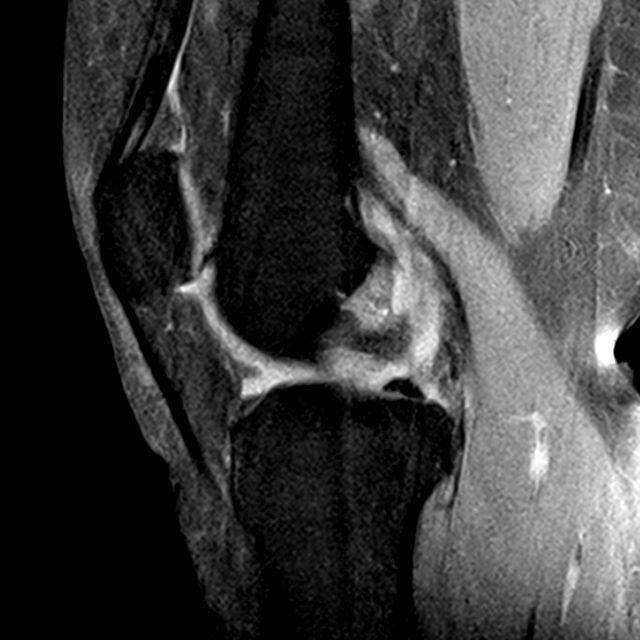
[im 29/29]
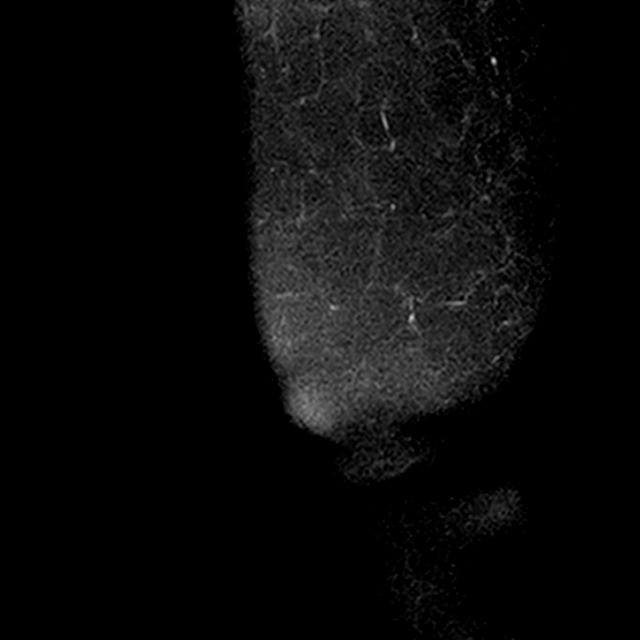

[Series 8: t2fs cor blade · coronal · 4.0mm · 0.44mm/px · 3 of 26 slices shown]
[im 1/26]
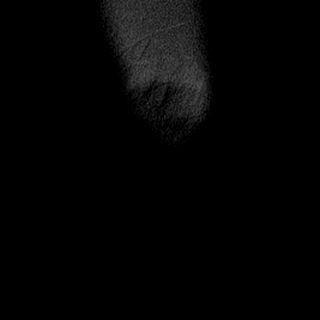
[im 13/26]
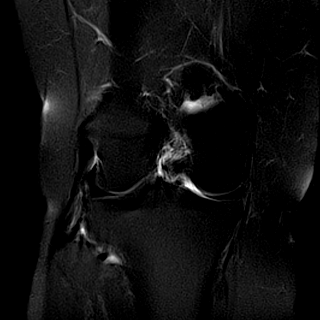
[im 26/26]
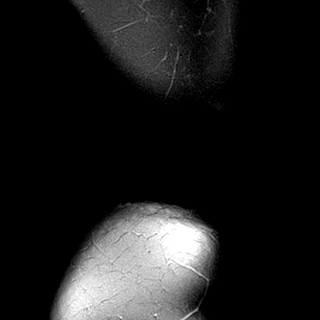

[17 of 40 positions shown; findings below may reference images not displayed]

FINDINGS: Exam is limited due to patient motion. Patient would not or could
not hold still for this examination.

MENISCI

Medial meniscus: Intrasubstance degenerative type signal changes but
no gross meniscal tear.

Lateral meniscus:  Intact

LIGAMENTS

Cruciates:  Intact

Collaterals:  Intact

CARTILAGE

Patellofemoral:  Intact

Medial:  Intact

Lateral:  Intact

Joint:  No joint effusion.

Popliteal Fossa:  No popliteal mass or Baker's cyst.

Extensor Mechanism: The patella retinacular structures are intact
and the quadriceps and patellar tendons are intact.

Bones:  No acute bony findings.

Other: Normal knee musculature.
IMPRESSION: 1. Limited examination due to patient motion.
2. Intact ligamentous structures and no acute bony findings.
3. No obvious/gross meniscal tears.
4. The articular cartilage appears intact. No obvious chondral
lesions or osteochondral defects.
5. No joint effusion or Baker's cyst.

## 2020-01-02 DIAGNOSIS — M41126 Adolescent idiopathic scoliosis, lumbar region: Secondary | ICD-10-CM | POA: Diagnosis not present

## 2020-01-02 DIAGNOSIS — M5416 Radiculopathy, lumbar region: Secondary | ICD-10-CM | POA: Diagnosis not present

## 2020-01-02 DIAGNOSIS — M4807 Spinal stenosis, lumbosacral region: Secondary | ICD-10-CM | POA: Diagnosis not present

## 2020-01-02 DIAGNOSIS — M48061 Spinal stenosis, lumbar region without neurogenic claudication: Secondary | ICD-10-CM | POA: Diagnosis not present

## 2020-01-14 DIAGNOSIS — Z5181 Encounter for therapeutic drug level monitoring: Secondary | ICD-10-CM | POA: Diagnosis not present

## 2020-01-14 DIAGNOSIS — G894 Chronic pain syndrome: Secondary | ICD-10-CM | POA: Diagnosis not present

## 2020-01-14 DIAGNOSIS — M5416 Radiculopathy, lumbar region: Secondary | ICD-10-CM | POA: Diagnosis not present

## 2020-01-14 DIAGNOSIS — M41126 Adolescent idiopathic scoliosis, lumbar region: Secondary | ICD-10-CM | POA: Diagnosis not present

## 2020-03-05 DIAGNOSIS — G894 Chronic pain syndrome: Secondary | ICD-10-CM | POA: Diagnosis not present

## 2020-03-05 DIAGNOSIS — Z5181 Encounter for therapeutic drug level monitoring: Secondary | ICD-10-CM | POA: Diagnosis not present

## 2020-03-10 DIAGNOSIS — M41126 Adolescent idiopathic scoliosis, lumbar region: Secondary | ICD-10-CM | POA: Diagnosis not present

## 2020-03-10 DIAGNOSIS — M5416 Radiculopathy, lumbar region: Secondary | ICD-10-CM | POA: Diagnosis not present

## 2020-03-31 DIAGNOSIS — M48061 Spinal stenosis, lumbar region without neurogenic claudication: Secondary | ICD-10-CM | POA: Diagnosis not present

## 2020-03-31 DIAGNOSIS — M5416 Radiculopathy, lumbar region: Secondary | ICD-10-CM | POA: Diagnosis not present

## 2020-03-31 DIAGNOSIS — M41126 Adolescent idiopathic scoliosis, lumbar region: Secondary | ICD-10-CM | POA: Diagnosis not present

## 2020-05-28 DIAGNOSIS — F411 Generalized anxiety disorder: Secondary | ICD-10-CM | POA: Diagnosis not present

## 2020-06-05 DIAGNOSIS — Z7189 Other specified counseling: Secondary | ICD-10-CM | POA: Diagnosis not present

## 2020-06-05 DIAGNOSIS — F064 Anxiety disorder due to known physiological condition: Secondary | ICD-10-CM | POA: Diagnosis not present

## 2020-07-17 ENCOUNTER — Other Ambulatory Visit (INDEPENDENT_AMBULATORY_CARE_PROVIDER_SITE_OTHER): Payer: Self-pay | Admitting: Nurse Practitioner

## 2020-10-19 ENCOUNTER — Emergency Department (HOSPITAL_COMMUNITY)
Admission: EM | Admit: 2020-10-19 | Discharge: 2020-10-19 | Disposition: A | Discharge status: Home / Self Care | Payer: Medicaid Other | Attending: Emergency Medicine | Admitting: Emergency Medicine

## 2020-10-19 ENCOUNTER — Other Ambulatory Visit: Payer: Self-pay

## 2020-10-19 ENCOUNTER — Encounter (HOSPITAL_COMMUNITY): Payer: Self-pay

## 2020-10-19 DIAGNOSIS — H60391 Other infective otitis externa, right ear: Secondary | ICD-10-CM | POA: Diagnosis not present

## 2020-10-19 DIAGNOSIS — K029 Dental caries, unspecified: Secondary | ICD-10-CM | POA: Insufficient documentation

## 2020-10-19 DIAGNOSIS — F17210 Nicotine dependence, cigarettes, uncomplicated: Secondary | ICD-10-CM | POA: Diagnosis not present

## 2020-10-19 DIAGNOSIS — H9201 Otalgia, right ear: Secondary | ICD-10-CM | POA: Diagnosis present

## 2020-10-19 MED ORDER — OFLOXACIN 0.3 % OT SOLN
5.0000 [drp] | Freq: Two times a day (BID) | OTIC | 0 refills | Status: AC
Start: 2020-10-19 — End: ?

## 2020-10-19 MED ORDER — FLUCONAZOLE 150 MG PO TABS: 150.0000 mg | ORAL_TABLET | Freq: Once | ORAL | 0 refills | Status: AC

## 2020-10-19 MED ORDER — IBUPROFEN 800 MG PO TABS
800.0000 mg | ORAL_TABLET | Freq: Two times a day (BID) | ORAL | 0 refills | Status: AC | PRN
Start: 2020-10-19 — End: 2020-10-26

## 2020-10-19 MED ORDER — OFLOXACIN 0.3 % OT SOLN: 5.0000 [drp] | Freq: Two times a day (BID) | OTIC | 0 refills | Status: DC

## 2020-10-19 MED ORDER — PENICILLIN V POTASSIUM 500 MG PO TABS
500.0000 mg | ORAL_TABLET | Freq: Four times a day (QID) | ORAL | 0 refills | Status: AC
Start: 2020-10-19 — End: 2020-10-26

## 2020-10-19 NOTE — ED Triage Notes (Signed)
Pt presents to ED with complaints of right ear pain x 1 week and yellow drainage. Pt states she had some antibiotics at home and she took them but drainage is worse and decreased hearing.

## 2020-10-19 NOTE — Discharge Instructions (Addendum)
Looks like you have a infection of the ear canal starting on eardrops please take as prescribed.  I want you to follow-up with a ENT doctor which I have provided to you or your primary care doctor in 1 week's time for reevaluation.  I have started you on antibiotic for your dental cavity you must follow-up with a dentist for reevaluation.  Come back to the emergency department if you develop chest pain, shortness of breath, severe abdominal pain, uncontrolled nausea, vomiting, diarrhea.

## 2020-10-19 NOTE — ED Provider Notes (Signed)
[facility] EMERGENCY DEPARTMENT Provider Note   CSN: [id number] Arrival date & time: 10/19/20  1721     History Chief Complaint  Patient presents with   Otalgia    Lindsey Parker is a 45 y.o. female.  HPI  Patient with no significant medical history presents to the emergency department with chief complaint of right-sided ear pain and drainage.  Patient states this started proximately 1 week ago, states that she is experiencing muffled hearing, purulent drainage, right-sided ear pain.  Patient denies recent trauma to the area, denies placing foreign bodies in her ears, denies recent swimming.  She states she never had an ear infection in the past, states that she had some leftover ciprofloxacin from a tooth infection which she started taking took for about 7 days without any real relief.  She denies alleviating factors.  She also notes that she has some right-sided facial swelling, states that she has an infected right upper tooth, states that she has not seen a dentist in a long time and thinks this is what is coming from.  She denies headaches, fevers, chills, does not endorse chest pain or shortness of breath.  Past Medical History:  Diagnosis Date   Abnormal Pap smear of cervix    cone, cryo - age 33   Anxiety    GERD (gastroesophageal reflux disease)    Ovarian cyst 03/09/2017   Sciatica    Scoliosis    Substance abuse (HCC)    over 10 years    Patient Active Problem List   Diagnosis Date Noted   Ovarian cyst 03/09/2017   Tobacco abuse 06/18/2016   Scoliosis 06/18/2016   GAD (generalized anxiety disorder) 06/18/2016   Substance abuse in remission (HCC) 06/18/2016    Past Surgical History:  Procedure Laterality Date   CESAREAN SECTION     x4   TUBAL LIGATION       OB History     Gravida  5   Para  4   Term      Preterm      AB  1   Living  4      SAB  1   IAB      Ectopic      Multiple      Live Births              Family History   Problem Relation Age of Onset   Miscarriages / [place] Mother    Drug abuse Father    Hypertension Father    Kidney disease Father    Cancer Father        kidney   Stroke Maternal Grandmother    Diabetes Maternal Grandmother    Asthma Brother    Cancer Maternal Aunt        breast    Social History   Tobacco Use   Smoking status: Every Day    Packs/day: 0.15    Pack years: 0.00    Types: Cigarettes    Start date: 04/19/1993   Smokeless tobacco: Never   Tobacco comments:    2-3 a day  Vaping Use   Vaping Use: Never used  Substance Use Topics   Alcohol use: No   Drug use: Yes    Types: Marijuana    Comment: 1-2 times a week    Home Medications Prior to Admission medications   Medication Sig Start Date End Date Taking? Authorizing Provider  fluconazole (DIFLUCAN) 150 MG tablet Take 1 tablet (150 mg total)  by mouth once for 1 dose. 10/19/20 10/19/20 Yes Carroll Sage, PA-C  ibuprofen (ADVIL) 800 MG tablet Take 1 tablet (800 mg total) by mouth 2 (two) times daily as needed for up to 7 days. 10/19/20 10/26/20 Yes Carroll Sage, PA-C  penicillin v potassium (VEETID) 500 MG tablet Take 1 tablet (500 mg total) by mouth 4 (four) times daily for 7 days. 10/19/20 10/26/20 Yes Carroll Sage, PA-C  ofloxacin (FLOXIN) 0.3 % OTIC solution Place 5 drops into the right ear 2 (two) times daily. 10/19/20   Carroll Sage, PA-C    Allergies    Patient has no known allergies.  Review of Systems   Review of Systems  Constitutional:  Negative for chills and fever.  HENT:  Positive for dental problem, ear discharge, ear pain and facial swelling. Negative for congestion, tinnitus and trouble swallowing.   Respiratory:  Negative for shortness of breath.   Cardiovascular:  Negative for chest pain.  Gastrointestinal:  Negative for abdominal pain.  Genitourinary:  Negative for enuresis.  Musculoskeletal:  Negative for back pain.  Skin:  Negative for rash.  Neurological:   Negative for dizziness.  Hematological:  Does not bruise/bleed easily.   Physical Exam Updated Vital Signs BP (!) 142/84 (BP Location: Right Arm)   Pulse 72   Temp 98.3 F (36.8 C)   Resp 18   Ht 5\' 3"  (1.6 m)   Wt 61.2 kg   SpO2 100%   BMI 23.91 kg/m   Physical Exam Vitals and nursing note reviewed.  Constitutional:      General: She is not in acute distress.    Appearance: Normal appearance. She is not ill-appearing or diaphoretic.  HENT:     Head: Normocephalic and atraumatic.     Right Ear: Tympanic membrane normal.     Left Ear: Tympanic membrane, ear canal and external ear normal.     Ears:     Comments: There is no noted tenderness along patient's mastoids bilaterally, there is no ear protrusion, patient's right ear was visualized there is purulent discharge present, edema noted within the ear canal, it was erythematous, TMs appear to be intact.    Nose: No congestion or rhinorrhea.     Mouth/Throat:     Mouth: Mucous membranes are moist.     Pharynx: Oropharynx is clear. No oropharyngeal exudate or posterior oropharyngeal erythema.     Comments: Oropharynx visualized tongue and uvula are both midline, controlling oral secretions, patient has noted dental cavity in the right upper molar, there is no erythema or edema along the gum lines, gumlines were palpated no fluctuant or indurations present.  No trismus or torticollis noted. Eyes:     Conjunctiva/sclera: Conjunctivae normal.  Pulmonary:     Effort: Pulmonary effort is normal.  Musculoskeletal:     Cervical back: Neck supple.  Skin:    General: Skin is warm and dry.  Neurological:     Mental Status: She is alert.  Psychiatric:        Mood and Affect: Mood normal.    ED Results / Procedures / Treatments   Labs (all labs ordered are listed, but only abnormal results are displayed) Labs Reviewed - No data to display  EKG None  Radiology No results found.  Procedures Procedures   Medications Ordered  in ED Medications - No data to display  ED Course  I have reviewed the triage vital signs and the nursing notes.  Pertinent labs & imaging results  that were available during my care of the patient were reviewed by me and considered in my medical decision making (see chart for details).    MDM Rules/Calculators/A&P                         Initial impression-patient presents with right-sided ear pain and dental pain.  She is alert, does not appear in acute stress, vital signs reassuring.  Work-up-due to well-appearing patient, benign physical exam, further lab or imaging I want at this time.  Rule out-low suspicion for mastoiditis as there is no ear protrusion, no tenderness along patient's mastoids.  Low suspicion for otitis media as TMs appear to be intact bilaterally, there is no bulging or erythema present.  Low suspicion for viral URI as there is no nasal gesturing, sore throat cough or general body aches. I have low suspicion for peritonsillar abscess, retropharyngeal abscess, or Ludwig angina as oropharynx was visualized tongue and uvula were both midline, there is no exudates, erythema or edema noted in the posterior pillars or on/ around tonsils.  Low suspicion for an abscess as gumline were palpated no fluctuance or induration felt.  Low suspicion for periorbital or orbital cellulitis as patient face had no erythematous, patient EOMs were intact, he had no pain with eye movement.   Plan-  Right ear pain-suspect otitis externa we will start her on antibiotics, have her follow-up with PCP and/or ENT for further evaluation. Patient is swelling-suspect secondary due to dental infection, will start on antibiotics follow-up with dentist for further evaluation.  Vital signs have remained stable, no indication for hospital admission.  Patient discussed with attending and they agreed with assessment and plan.  Patient given at home care as well strict return precautions.  Patient verbalized  that they understood agreed to said plan.  Final Clinical Impression(s) / ED Diagnoses Final diagnoses:  Other infective acute otitis externa of right ear  Dental caries    Rx / DC Orders ED Discharge Orders          Ordered    penicillin v potassium (VEETID) 500 MG tablet  4 times daily        10/19/20 1911    ofloxacin (FLOXIN) 0.3 % OTIC solution  2 times daily,   Status:  Discontinued        10/19/20 1911    fluconazole (DIFLUCAN) 150 MG tablet   Once        10/19/20 1911    ibuprofen (ADVIL) 800 MG tablet  2 times daily PRN        10/19/20 1911    ofloxacin (FLOXIN) 0.3 % OTIC solution  2 times daily        10/19/20 1911             Barnie Del 10/19/20 1913    Terrilee Files, MD 10/20/20 1051
